# Patient Record
Sex: Male | Born: 1950 | Race: White | Hispanic: No | Marital: Married | Smoking: Never smoker
Health system: Southern US, Community
[De-identification: ages and names within clinical notes are randomized; demographics above are authoritative.]

## PROBLEM LIST (undated history)

## (undated) DIAGNOSIS — G40909 Epilepsy, unspecified, not intractable, without status epilepticus: Secondary | ICD-10-CM

## (undated) DIAGNOSIS — E785 Hyperlipidemia, unspecified: Secondary | ICD-10-CM

## (undated) DIAGNOSIS — Z9049 Acquired absence of other specified parts of digestive tract: Secondary | ICD-10-CM

## (undated) DIAGNOSIS — D3A00 Benign carcinoid tumor of unspecified site: Secondary | ICD-10-CM

## (undated) DIAGNOSIS — I4891 Unspecified atrial fibrillation: Secondary | ICD-10-CM

## (undated) DIAGNOSIS — I7771 Dissection of carotid artery: Secondary | ICD-10-CM

## (undated) HISTORY — DX: Epilepsy, unspecified, not intractable, without status epilepticus: G40.909

## (undated) HISTORY — PX: SMALL INTESTINE SURGERY: SHX150

## (undated) HISTORY — PX: PARTIAL COLECTOMY: SHX5273

## (undated) HISTORY — DX: Hyperlipidemia, unspecified: E78.5

## (undated) HISTORY — DX: Dissection of carotid artery: I77.71

## (undated) HISTORY — DX: Unspecified atrial fibrillation: I48.91

## (undated) HISTORY — DX: Acquired absence of other specified parts of digestive tract: Z90.49

---

## 1998-01-15 ENCOUNTER — Ambulatory Visit (HOSPITAL_COMMUNITY): Admission: RE | Admit: 1998-01-15 | Discharge: 1998-01-15 | Payer: Self-pay | Admitting: Neurosurgery

## 1998-01-29 ENCOUNTER — Emergency Department (HOSPITAL_COMMUNITY): Admission: EM | Admit: 1998-01-29 | Discharge: 1998-01-29 | Payer: Self-pay | Admitting: Emergency Medicine

## 2001-05-09 ENCOUNTER — Encounter: Payer: Self-pay | Admitting: Internal Medicine

## 2001-05-09 ENCOUNTER — Ambulatory Visit (HOSPITAL_COMMUNITY): Admission: RE | Admit: 2001-05-09 | Discharge: 2001-05-09 | Payer: Self-pay | Admitting: Internal Medicine

## 2002-11-14 ENCOUNTER — Encounter: Admission: RE | Admit: 2002-11-14 | Discharge: 2003-02-12 | Payer: Self-pay | Admitting: Internal Medicine

## 2003-05-24 ENCOUNTER — Ambulatory Visit (HOSPITAL_COMMUNITY): Admission: RE | Admit: 2003-05-24 | Discharge: 2003-05-24 | Payer: Self-pay | Admitting: Neurology

## 2003-05-24 ENCOUNTER — Encounter: Payer: Self-pay | Admitting: Neurology

## 2003-06-07 ENCOUNTER — Ambulatory Visit (HOSPITAL_COMMUNITY): Admission: RE | Admit: 2003-06-07 | Discharge: 2003-06-07 | Payer: Self-pay | Admitting: Neurology

## 2003-12-19 ENCOUNTER — Inpatient Hospital Stay (HOSPITAL_COMMUNITY): Admission: EM | Admit: 2003-12-19 | Discharge: 2003-12-31 | Payer: Self-pay | Admitting: Emergency Medicine

## 2003-12-25 ENCOUNTER — Encounter (INDEPENDENT_AMBULATORY_CARE_PROVIDER_SITE_OTHER): Payer: Self-pay | Admitting: Specialist

## 2004-01-15 ENCOUNTER — Encounter: Admission: RE | Admit: 2004-01-15 | Discharge: 2004-01-15 | Payer: Self-pay | Admitting: General Surgery

## 2004-07-23 ENCOUNTER — Encounter: Admission: RE | Admit: 2004-07-23 | Discharge: 2004-07-23 | Payer: Self-pay | Admitting: General Surgery

## 2005-01-18 ENCOUNTER — Encounter: Admission: RE | Admit: 2005-01-18 | Discharge: 2005-01-18 | Payer: Self-pay | Admitting: General Surgery

## 2005-02-12 ENCOUNTER — Encounter: Payer: Self-pay | Admitting: Cardiology

## 2005-03-02 ENCOUNTER — Ambulatory Visit: Payer: Self-pay

## 2005-03-04 ENCOUNTER — Ambulatory Visit: Payer: Self-pay | Admitting: Cardiology

## 2005-07-16 ENCOUNTER — Encounter: Admission: RE | Admit: 2005-07-16 | Discharge: 2005-07-16 | Payer: Self-pay | Admitting: General Surgery

## 2005-07-21 ENCOUNTER — Ambulatory Visit: Payer: Self-pay | Admitting: Cardiology

## 2005-10-25 ENCOUNTER — Encounter: Admission: RE | Admit: 2005-10-25 | Discharge: 2005-10-25 | Payer: Self-pay | Admitting: Neurology

## 2006-04-06 ENCOUNTER — Encounter: Admission: RE | Admit: 2006-04-06 | Discharge: 2006-04-06 | Payer: Self-pay | Admitting: Neurology

## 2006-05-20 ENCOUNTER — Encounter: Admission: RE | Admit: 2006-05-20 | Discharge: 2006-05-20 | Payer: Self-pay | Admitting: General Surgery

## 2006-08-19 ENCOUNTER — Ambulatory Visit: Payer: Self-pay | Admitting: Cardiology

## 2007-04-26 IMAGING — CT CT PELVIS W/ CM
2 of 5 series · 13 of 36 positions shown, 19 images · IV contrast (READY/WATER & [ID] OMNI 300)
Comparison: Abdominal/pelvic CT, 07/16/05.

CLINICAL DATA: Carcinoid tumor of the colon post resection, January 2004.  Chronic diarrhea. 
 ABDOMEN CT WITH CONTRAST:
TECHNIQUE: Multidetector CT imaging of the abdomen was performed following the standard protocol during bolus administration of intravenous contrast.
 Contrast:  100 cc Omnipaque 300.   Oral contrast was given.
TECHNIQUE: Multidetector CT imaging of the pelvis was performed following the standard protocol during bolus administration of intravenous contrast.

[Series 2: a&p w/ · axial · 0.70mm/px · z∈[-396,-76]mm · 9 of 81 slices shown, 15 images (1 of 2)]
[im 9/81  soft-tissue]
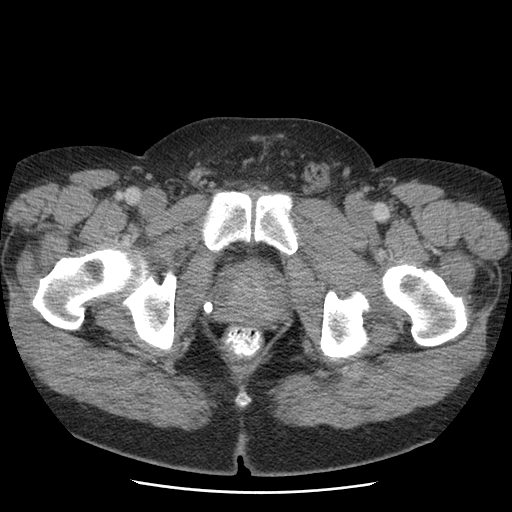
[im 9/81  bone]
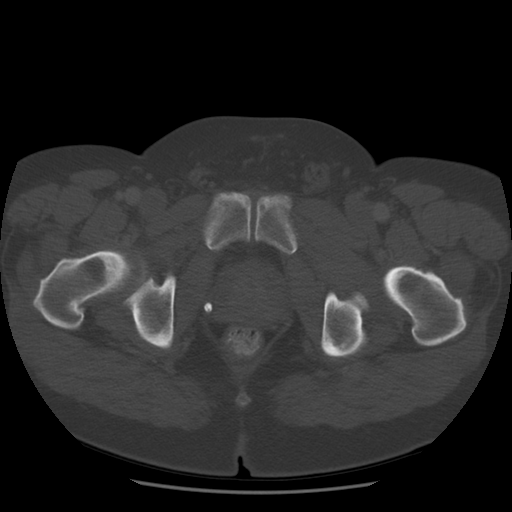
[im 17/81  soft-tissue]
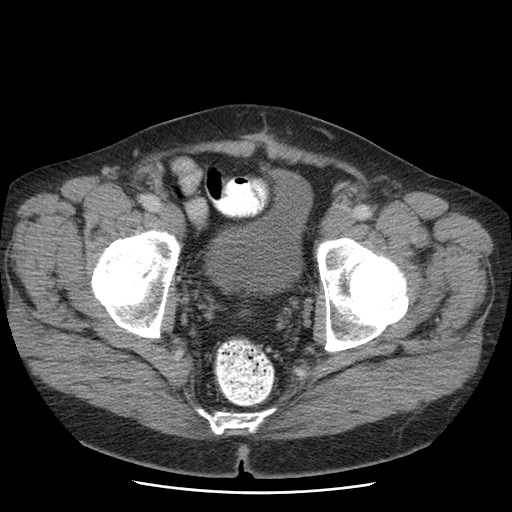
[im 25/81  soft-tissue]
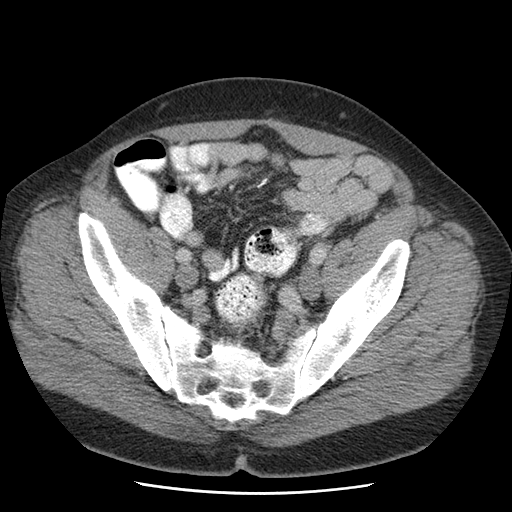
[im 33/81  soft-tissue]
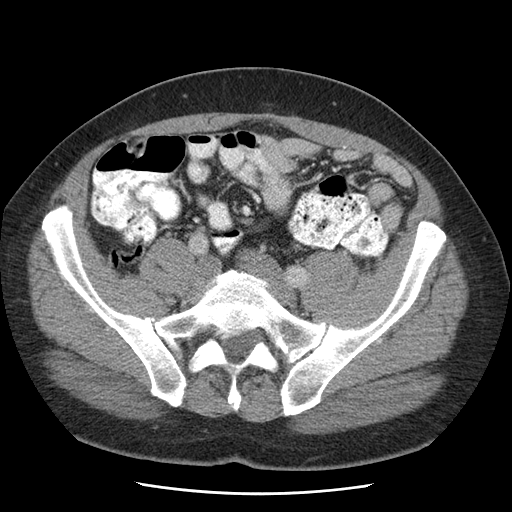
[im 41/81  soft-tissue]
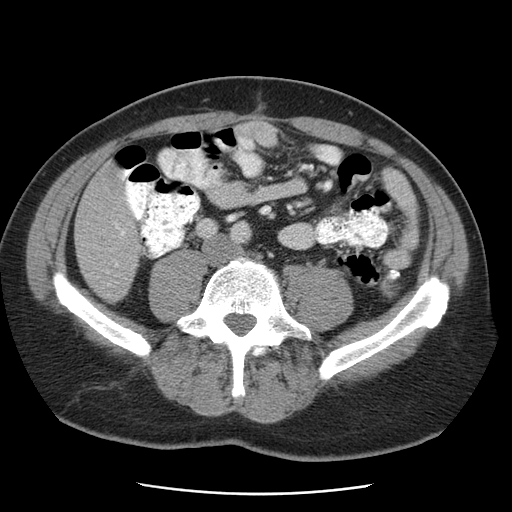
[im 49/81  soft-tissue]
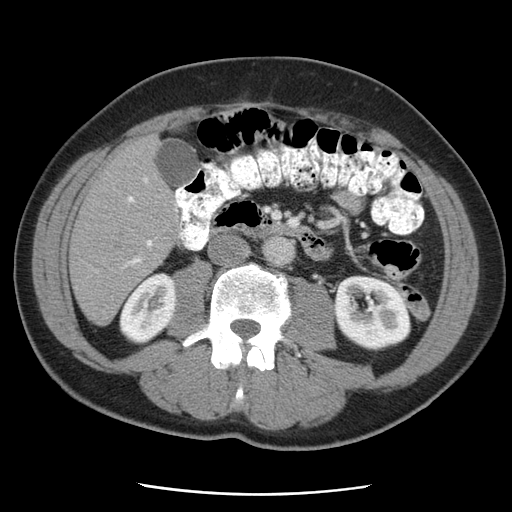
[im 49/81  lung]
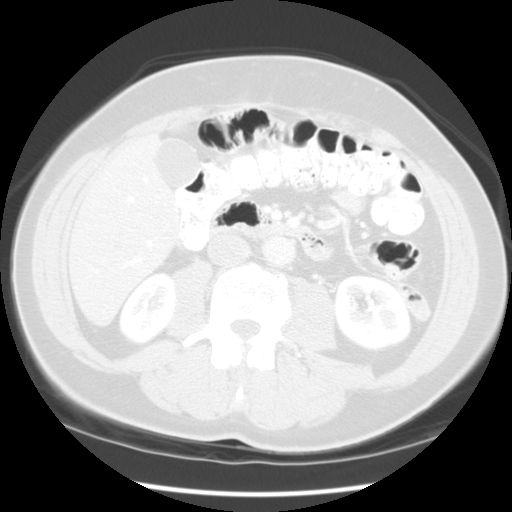
[im 57/81  soft-tissue]
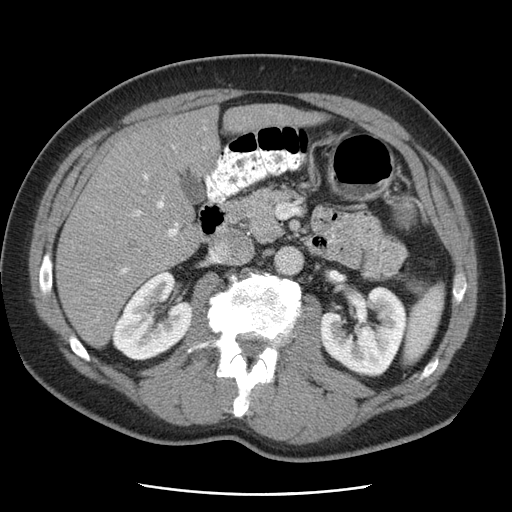
[im 57/81  lung]
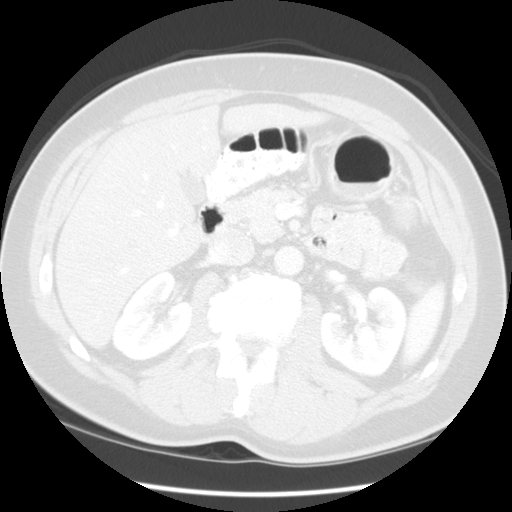
[im 65/81  soft-tissue]
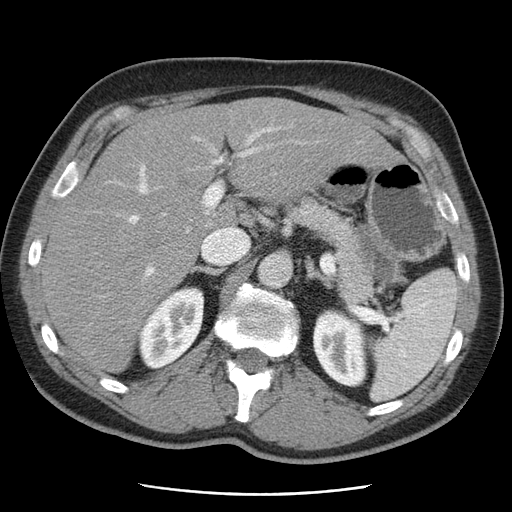
[im 65/81  lung]
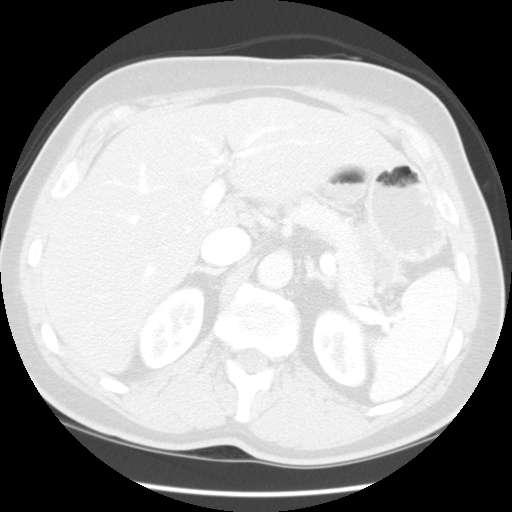
[im 73/81  soft-tissue]
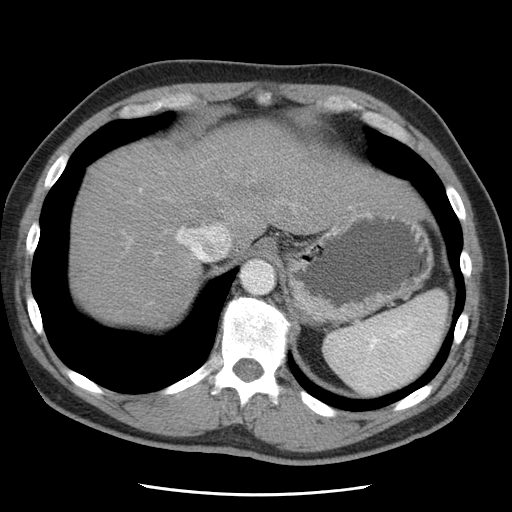
[im 73/81  lung]
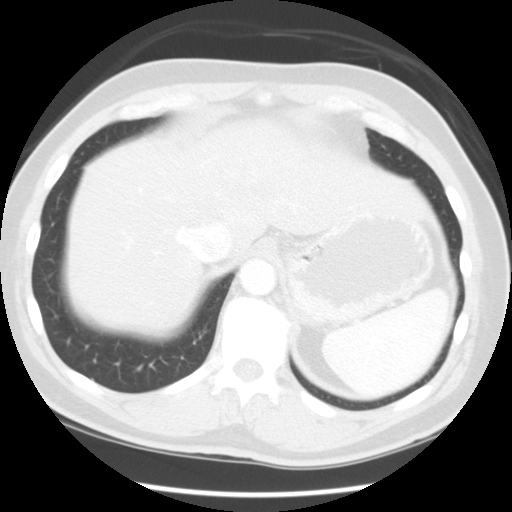
[im 73/81  bone]
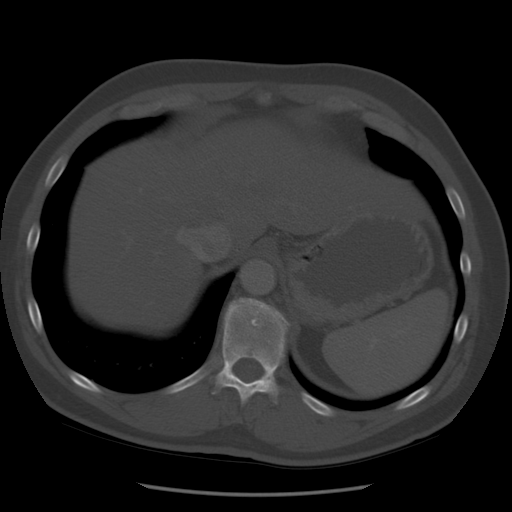

[Series 102: a&p w/ · axial · 0.70mm/px · z∈[-188,-128]mm · 4 of 130 slices shown (2 of 2)]
[im 9/130  soft-tissue]
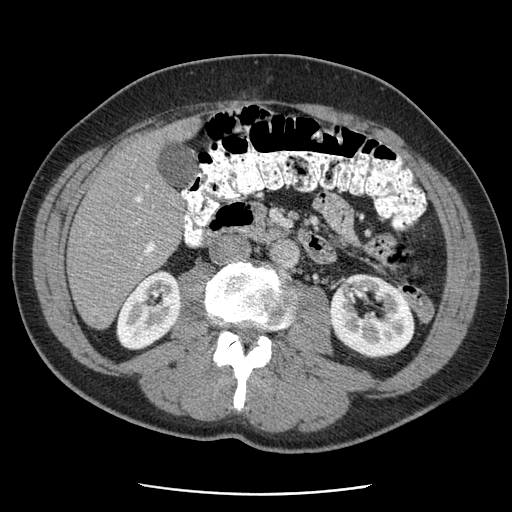
[im 25/130  soft-tissue]
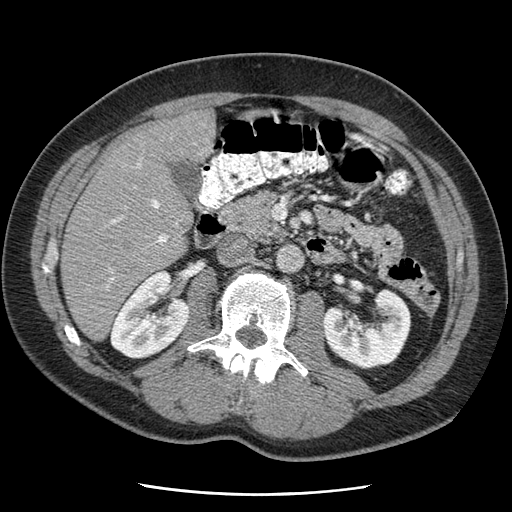
[im 41/130  soft-tissue]
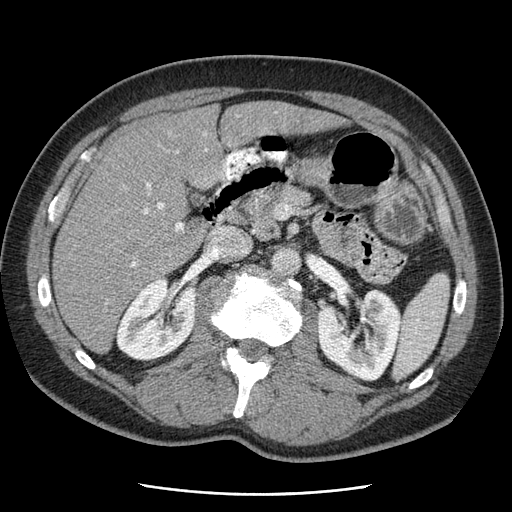
[im 57/130  soft-tissue]
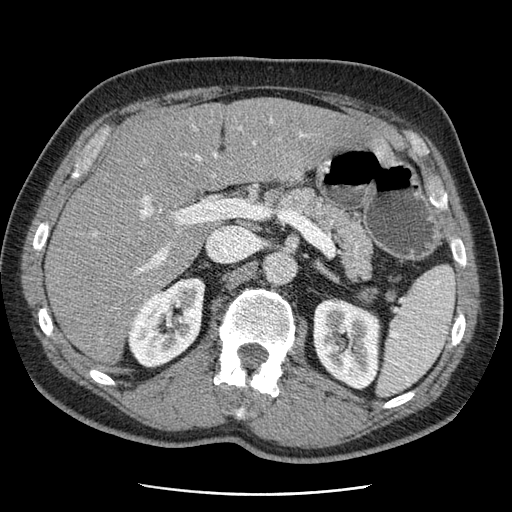

[13 of 36 positions shown; findings below may reference images not displayed]

FINDINGS: The lung bases are clear.  There is no pleural effusion.  The liver demonstrates no abnormal enhancement or focal lesion. The spleen, gallbladder, pancreas, adrenal glands and kidneys appear normal.  No enlarged abdominal lymph nodes are seen.   Arabogo Malm caval and peripancreatic lymph nodes are unchanged.  No ascites or omental nodularity is demonstrated.
IMPRESSION: Stable abdominal CT.  No evidence of metastatic disease. 
 PELVIS CT WITH CONTRAST:
FINDINGS: There is no pelvic mass, fluid collection, or inflammatory process.  No enlarged pelvic lymph nodes are seen.  The appendix is visualized and appears normal.
IMPRESSION: Stable examination. No evidence of metastatic disease.

## 2007-08-08 ENCOUNTER — Ambulatory Visit: Payer: Self-pay | Admitting: Cardiology

## 2008-08-08 ENCOUNTER — Encounter: Payer: Self-pay | Admitting: Cardiology

## 2008-08-08 ENCOUNTER — Ambulatory Visit: Payer: Self-pay

## 2008-08-08 ENCOUNTER — Ambulatory Visit: Payer: Self-pay | Admitting: Cardiology

## 2008-08-08 DIAGNOSIS — I119 Hypertensive heart disease without heart failure: Secondary | ICD-10-CM

## 2008-08-08 DIAGNOSIS — E7800 Pure hypercholesterolemia, unspecified: Secondary | ICD-10-CM | POA: Insufficient documentation

## 2008-08-08 DIAGNOSIS — I4891 Unspecified atrial fibrillation: Secondary | ICD-10-CM | POA: Insufficient documentation

## 2009-08-18 ENCOUNTER — Encounter: Payer: Self-pay | Admitting: Cardiology

## 2009-08-19 ENCOUNTER — Ambulatory Visit: Payer: Self-pay | Admitting: Cardiology

## 2010-01-23 ENCOUNTER — Encounter: Payer: Self-pay | Admitting: Cardiology

## 2010-01-25 ENCOUNTER — Encounter: Payer: Self-pay | Admitting: Cardiology

## 2010-01-29 ENCOUNTER — Telehealth (INDEPENDENT_AMBULATORY_CARE_PROVIDER_SITE_OTHER): Payer: Self-pay | Admitting: *Deleted

## 2010-01-29 ENCOUNTER — Ambulatory Visit: Payer: Self-pay | Admitting: Cardiology

## 2010-01-29 DIAGNOSIS — R072 Precordial pain: Secondary | ICD-10-CM | POA: Insufficient documentation

## 2010-01-29 LAB — CONVERTED CEMR LAB
BUN: 17 mg/dL (ref 6–23)
CO2: 31 meq/L (ref 19–32)
Chloride: 105 meq/L (ref 96–112)
Creatinine, Ser: 1 mg/dL (ref 0.4–1.5)
Sodium: 140 meq/L (ref 135–145)

## 2010-02-04 ENCOUNTER — Telehealth: Payer: Self-pay | Admitting: Cardiology

## 2010-02-18 ENCOUNTER — Telehealth: Payer: Self-pay | Admitting: Cardiology

## 2010-07-07 ENCOUNTER — Encounter: Payer: Self-pay | Admitting: Cardiology

## 2010-07-09 ENCOUNTER — Encounter: Payer: Self-pay | Admitting: Cardiology

## 2010-07-10 ENCOUNTER — Ambulatory Visit: Payer: Self-pay | Admitting: Cardiology

## 2010-08-21 ENCOUNTER — Ambulatory Visit: Payer: Self-pay

## 2010-08-21 ENCOUNTER — Ambulatory Visit: Payer: Self-pay | Admitting: Cardiology

## 2010-10-06 NOTE — Progress Notes (Signed)
Summary: Question about stress tes  Phone Note Call from Patient Call back at [phone]   Caller: Patient Summary of Call: Pt calling regarding a stress test Initial call taken by: Judie Grieve,  February 18, 2010 4:17 PM  Follow-up for Phone Call        Spoke with pt. regarding stress test. Pt. would like to know if it was okay for Dr. Juanda Chance to re-schedule Myoview  in November this year. I let pt. know on the last phone note, Dr. Juanda Chance said it is fine to wait . Pt is to call to schedule test sooner if he has any symptoms. Pt. verbalized understanding. Follow-up by: Ollen Gross, RN, BSN,  February 18, 2010 4:44 PM

## 2010-10-06 NOTE — Progress Notes (Signed)
Summary: QUESTION ABOUT STRESS TEST  Phone Note Call from Patient Call back at [phone]   Caller: Patient Summary of Call: PT HAVE QUESTION ABOUT STRESS TEST  AND RS THE TEST Initial call taken by: Judie Grieve,  February 04, 2010 11:40 AM  Follow-up for Phone Call        I called and spoke with the pt. He has not been approved for the PROMISE study. He has cancelled his myoview for tomorrow. He states he has only had one isolated incident of chest burning and that he felt like the myoview was being done primarily as a f/u since his last one has been so long ago. He states he now has a $1000 deductible to meet. He would like to know if Dr. Juanda Chance feels that he can wait until November to have his test done since he will be eliglble for [employer] at that time. I explained I will review this with Dr. Juanda Chance. He has had no other symptoms. Follow-up by: Sherri Rad, RN, BSN,  February 04, 2010 1:23 PM  Additional Follow-up for Phone Call Additional follow up Details #1::        Per Dr. Juanda Chance- ok to wait until November for [facility]. The pt is aware. He will call to schedule sooner should he have any more symptoms.  Additional Follow-up by: Sherri Rad, RN, BSN,  February 06, 2010 11:32 AM

## 2010-10-06 NOTE — Progress Notes (Signed)
  Phone Note Outgoing Call   Action Taken: Information Sent Details for Reason: Promise Study Summary of Call: I spoke to patient to let him know labs were okay. Pt was scheduled for exercise stress on 02/05/2010 at 8am. instructions were given to pt. Pt verbalized understanding. Pt is to hold Metformin and insulin day of test.

## 2010-10-06 NOTE — Consult Note (Signed)
Summary: [facility] Primary [facility] Referral Form/Office Note  [facility] Primary Care Referral Form/Office Note   Imported By: Roderic Ovens 02/26/2010 09:38:46  _____________________________________________________________________  External Attachment:    Type:   Image     Comment:   External Document

## 2010-10-06 NOTE — Assessment & Plan Note (Signed)
Summary: rov   Visit Type:  Initial Consult Primary Provider:  Dr .Olivia Canter  CC:  pt saw Dr Tresa Endo he had eposide of chest pain and arm pain . Pt had a ekg there.Dr Tresa Endo thought it may be time for pt to have an stress test.  History of Present Illness: Patient returned for a followup visit early because of symptoms of chest and arm pain. He developed some sharp left-sided chest pain a couple of weeks ago which was not related to exertion. He later developed some intermittent pain in his left arm which was also not related to exertion. He saw his primary care physician who did an electrocardiogram which was unchanged from his previous ECGs here. Because of his positive risk profile he was referred here for further evaluation. He has had no exertional chest pain.  He has chronic atrial fibrillation and this is being managed with rate control and Coumadin. He also has hypertension, hyperlipidemia and diabetes.  His previous history is significant for a carotid dissection in the past, seizure disorder, and carcinoid syndrome requiring surgery.  Current Medications (verified): 1)  Tricor 145 Mg Tabs (Fenofibrate) .Marland Kitchen.. 1 Tab Once Daily 2)  Crestor 10 Mg Tabs (Rosuvastatin Calcium) .... Take One Tablet By Mouth Daily. 3)  Warfarin Sodium 5 Mg Tabs (Warfarin Sodium) .... Use As Directed By Anticoagulation Clinic 4)  Diltiazem Hcl Cr 180 Mg Xr24h-Cap (Diltiazem Hcl) .Marland Kitchen.. 1 Tab Once Daily 5)  Digoxin 0.25 Mg Tabs (Digoxin) .Marland Kitchen.. 1 Tab Once Daily 6)  Altace 10 Mg Caps (Ramipril) .Marland Kitchen.. 1 Tab Once Daily 7)  Metformin Hcl 500 Mg Tabs (Metformin Hcl) .... 2 Tabs Two Times A Day 8)  Lantus 100 Unit/ml Soln (Insulin Glargine) .... As Directed  Allergies (verified): 1)  ! * Tamiflu  Past History:  Past Medical History: Reviewed history from 08/08/2008 and no changes required.   1. Chronic atrial fibrillation. 2. Diabetes. 3. History of previous spontaneous carotid artery dissection. 4. History  of a partial bowel resection for a carcinoid. 5. History of seizure disorder. 6. Hyperlipidemia.   Review of Systems       ROS is negative except as outlined in HPI.   Vital Signs:  Patient profile:   60 year old male Height:      70 inches Weight:      226 pounds BMI:     32.54 Pulse rate:   82 / minute BP sitting:   106 / 71  (left arm) Cuff size:   large  Vitals Entered By: Burnett Kanaris, CNA (Jan 29, 2010 10:51 AM)  Physical Exam  Additional Exam:  Gen. Well-nourished, in no distress   Neck: No JVD, thyroid not enlarged, no carotid bruits Lungs: No tachypnea, clear without rales, rhonchi or wheezes Cardiovascular: Rhythm regular, PMI not displaced,  heart sounds  normal, no murmurs or gallops, no peripheral edema, pulses normal in all 4 extremities. Abdomen: BS normal, abdomen soft and non-tender without masses or organomegaly, no hepatosplenomegaly. MS: No deformities, no cyanosis or clubbing   Neuro:  No focal sns   Skin:  no lesions    Impression & Recommendations:  Problem # 1:  CHEST PAIN-PRECORDIAL (ICD-786.51)  His chest pain is atypical for ischemia but he does have a very high risk profile with diabetes, hypertension and hyperlipidemia. I think we should allow him further. I discussed with him they promise trial. If he enrolled in this trial he would be randomized to our standard evaluation versus CT angiography.  In this case I would recommend either a rest stress exercise nuclear study or CT angiography. Will discuss with him about enrollment in this trial as a way to evaluate his problem further. His updated medication list for this problem includes:    Warfarin Sodium 5 Mg Tabs (Warfarin sodium) ..... Use as directed by anticoagulation clinic    Diltiazem Hcl Cr 180 Mg Xr24h-cap (Diltiazem hcl) .Marland Kitchen... 1 tab once daily    Altace 10 Mg Caps (Ramipril) .Marland Kitchen... 1 tab once daily  Orders: TLB-BMP (Basic Metabolic Panel-BMET) (80048-METABOL) Nuclear Stress Test (Nuc  Stress Test)  Problem # 2:  ATRIAL FIBRILLATION (ICD-427.31) He has chronic atrial fibrillation and is managed with rate control and Coumadin. This problem appears stable. His updated medication list for this problem includes:    Warfarin Sodium 5 Mg Tabs (Warfarin sodium) ..... Use as directed by anticoagulation clinic    Digoxin 0.25 Mg Tabs (Digoxin) .Marland Kitchen... 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 His updated medication list for this problem includes:    Diltiazem Hcl Cr 180 Mg Xr24h-cap (Diltiazem hcl) .Marland Kitchen... 1 tab once daily    Altace 10 Mg Caps (Ramipril) .Marland Kitchen... 1 tab once daily  Patient Instructions: 1)  Your physician recommends that you have lab work today: bmet (786.50;427.31) 2)  You are being enrolled in the PROMISE study. 3)  Your physician recommends that you continue on your current medications as directed. Please refer to the Current Medication list given to you today. 4)  Your physician wants you to follow-up in:  6 months. You will receive a reminder letter in the mail two months in advance. If you don't receive a letter, please call our office to schedule the follow-up appointment.

## 2010-10-06 NOTE — Assessment & Plan Note (Signed)
Summary: f64m   Visit Type:  Follow-up Primary Provider:  Dr .Olivia Canter  CC:  no complaints.  History of Present Illness: Mitchell Bright is 60 years old and return for management of chronic atrial fibrillation. He has a long history of chronic atrial fibrillation managed with rate control and Coumadin. He also has diabetes hypertension and hyperlipidemia.  When I saw him last time he had some atypical chest pain and would consider doing a stress test but we ended up canceling this to insurance reasons and his symptoms have cleared.  He has occasional palpitations and no chest pain or shortness of breath.  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 He's getting involved with mediations more than litigation.  Current Medications (verified): 1)  Tricor 145 Mg Tabs (Fenofibrate) .Marland Kitchen.. 1 Tab Once Daily 2)  Crestor 10 Mg Tabs (Rosuvastatin Calcium) .... Take One Tablet By Mouth Daily. 3)  Warfarin Sodium 5 Mg Tabs (Warfarin Sodium) .... Use As Directed By Anticoagulation Clinic 4)  Diltiazem Hcl Cr 180 Mg Xr24h-Cap (Diltiazem Hcl) .Marland Kitchen.. 1 Tab Once Daily 5)  Digoxin 0.25 Mg Tabs (Digoxin) .Marland Kitchen.. 1 Tab Once Daily 6)  Altace 10 Mg Caps (Ramipril) .Marland Kitchen.. 1 Tab Once Daily 7)  Metformin Hcl 500 Mg Tabs (Metformin Hcl) .... 2 Tabs Two Times A Day 8)  Lantus 100 Unit/ml Soln (Insulin Glargine) .... As Directed 9)  Dilantin .... 2 Tabs Two Times A Day  Allergies (verified): 1)  ! * Tamiflu  Past History:  Past Medical History: 1. Chronic atrial fibrillation. 2. Diabetes. 3. History of previous spontaneous carotid artery dissection. 4. History of a partial bowel resection for a carcinoid. 5. History of seizure disorder. 6. Hyperlipidemia.   Review of Systems       ROS is negative except as outlined in HPI.   Vital Signs:  Patient profile:   60 year old male Height:      70 inches Weight:      224 pounds BMI:     32.26 BP sitting:   118 / 80  (left arm) Cuff  size:   regular  Vitals Entered By: Hardin Negus, RMA (July 10, 2010 12:10 PM)  Physical Exam  Additional Exam:  Gen. Well-nourished, in no distress   Neck: No JVD, thyroid not enlarged, no carotid bruits Lungs: No tachypnea, clear without rales, rhonchi or wheezes Cardiovascular: Rhythm irregular, PMI not displaced,  heart sounds  normal, no murmurs or gallops, no peripheral edema, pulses normal in all 4 extremities. Abdomen: BS normal, abdomen soft and non-tender without masses or organomegaly, no hepatosplenomegaly. MS: No deformities, no cyanosis or clubbing   Neuro:  No focal sns   Skin:  no lesions    Impression & Recommendations:  Problem # 1:  ATRIAL FIBRILLATION (ICD-427.31) He has chronic atrial fibrillation managed with Coumadin and rate control. this problem is stable. His updated medication list for this problem includes:    Warfarin Sodium 5 Mg Tabs (Warfarin sodium) ..... Use as directed by anticoagulation clinic    Digoxin 0.25 Mg Tabs (Digoxin) .Marland Kitchen... 1 tab once daily  Problem # 2:  HYPERCHOLESTEROLEMIA (ICD-272.0) He had a recent lipid profile by his primary care physician. His HDL was 32. Profile is otherwise good. I encouraged him on exercise weight reduction and low back carbohydrate diet. His updated medication list for this problem includes:    Tricor 145 Mg Tabs (Fenofibrate) .Marland Kitchen... 1 tab once daily  Crestor 10 Mg Tabs (Rosuvastatin calcium) .Marland Kitchen... Take one tablet by mouth daily.  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 His updated medication list for this problem includes:    Diltiazem Hcl Cr 180 Mg Xr24h-cap (Diltiazem hcl) .Marland Kitchen... 1 tab once daily    Altace 10 Mg Caps (Ramipril) .Marland Kitchen... 1 tab once daily  Appended Document: f65m    Clinical Lists Changes  Orders: Added new Service order of EKG w/ Interpretation (93000) - Signed Added new Referral order of  Treadmill (Treadmill) - Signed Observations: Added new observation of PI CARDIO: Your physician has requested that you have an exercise tolerance test in mid December with Dr. Juanda Chance. For further information please visit [email or web address]  Please also follow instruction sheet, as given. (07/10/2010 12:33)       Patient Instructions: 1)  Your physician has requested that you have an exercise tolerance test in mid December with Dr. Juanda Chance. For further information please visit [email or web address]  Please also follow instruction sheet, as given.

## 2011-01-19 NOTE — Assessment & Plan Note (Signed)
[facility]                            CARDIOLOGY OFFICE NOTE   Mitchell Bright                       MRN:          [id number]  DATE:08/08/2007                            DOB:          16-Jul-1951    PRIMARY CARE PHYSICIAN:  Dr. Pearson Bright in [place].   CLINICAL HISTORY:  Mitchell Bright is 60 years old and has chronic atrial  fibrillation.  He is currently on rate-control medications and Coumadin.  His [place] score is 1 based on diabetes.   He has been doing quite well and has had no chest pain or shortness of  breath.  He has had a rare palpitation this morning.   PAST MEDICAL HISTORY:  Significant for diabetes and hyperlipidemia.  He  indicated recent hemoglobin A1c was 11 and so his medications were  recently changed.   CURRENT MEDICATIONS:  Include TriCor, Crestor, Coumadin, aspirin,  Dilantin, diltiazem, digoxin, Altace, Avandaryl, Janumet, and Lovaza.   EXAMINATION TODAY:  The blood pressure is 113/75 and the pulse 80 and  irregular.  There is no venous distention.  The carotid pulses were full  without bruits.  Chest was clear.  Cardiac rhythm was irregular.  He had  no murmurs or gallops.  Abdomen was soft without organomegaly.  Peripheral pulses are full and there is no peripheral edema.   An electrocardiogram showed atrial fibrillation with controlled rate.  There were nonspecific ST-T changes.   IMPRESSION:  1. Chronic atrial fibrillation.  2. Diabetes.  3. History of previous spontaneous carotid artery dissection.  4. History of a partial bowel resection for a carcinoid.  5. History of seizure disorder.  6. Hyperlipidemia.   RECOMMENDATIONS:  I think Mitchell Bright is doing quite well.  We will not make  any change in his medication.  We will plan to see him back in followup  in a year and do an echocardiogram on that day at that visit.     Mitchell Elvera Lennox Juanda Chance, MD, [facility]  Electronically Signed    BRB/MedQ  DD: 08/08/2007  DT:  08/09/2007  Job #: [id number]

## 2011-01-19 NOTE — Assessment & Plan Note (Signed)
Mitchell Bright                            CARDIOLOGY OFFICE NOTE   Mitchell Bright, Mitchell Bright                       MRN:          [id number]  DATE:08/08/2008                            DOB:          24-Aug-1951    PRIMARY CARE PHYSICIAN:  Mitchell Forster, MD in [place].   CLINICAL HISTORY:  Mitchell Bright is a 60 year old who returned for  management of chronic atrial fibrillation.  Mitchell Bright developed atrial  fibrillation and was asymptomatic, and we did not try and restore sinus  rhythm.  Mitchell CHADS score is 2 based on diabetes and hypertension.  He is  being managed on Coumadin and rate control medications.   He stated he has been doing quite well.  He has had no recent chest  pain, shortness of breath, or palpitations.   Mitchell past medical history is significant for diabetes, hyperlipidemia,  and hypertension.  He also has a history of spontaneous carotid artery  dissection, and he has a history of a seizure disorder.   CURRENT MEDICATIONS:  1. TriCor 145 mg daily.  2. Crestor 10 mg daily.  3. Coumadin.  4. Dilantin.  5. Aspirin 81 mg daily.  6. Diltiazem 180 mg daily.  7. Digoxin 0.25 mg daily.  8. Altace 10 mg daily.  9. __________.  10.Metformin.  11.Lantus insulin, which is new.   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  He states Mitchell work is quite busy.  Mitchell Bright, Mitchell  former [name], previously had worked for [employer] and now  has taken a job with [employer].  He does not smoke.   PHYSICAL EXAMINATION:  Blood pressure 114/68 and pulse 84 and regular.  There is no venous distension.  Mitchell carotid pulses are full without  bruits.  Chest is clear.  Cardiac rhythm is irregular.  I hear no  murmurs or gallops.  Mitchell abdomen is soft with normal bowel sounds.  There is no hepatosplenomegaly.  Peripheral pulses are full.  There is  no peripheral edema.   Electrocardiogram showed atrial  fibrillation with controlled rate and  was otherwise normal.   IMPRESSION:  1. Chronic atrial fibrillation, managed with rate control and      Coumadin.  2. Diabetes.  3. Hypertension.  4. History of previous spontaneous carotid artery dissection.  5. History of seizure disorder.  6. History of partial small bowel resection for carcinoid.  7. Hyperlipidemia.   RECOMMENDATIONS:  I think, Mitchell Bright is doing quite well.  We did an  echocardiogram today, and Mitchell LV function is good with an ejection  fraction that I estimated at 60%.  We will not make any change in Mitchell  treatment today, and we will plan to see him back for a followup visit  in 1 year.     Bruce Elvera Lennox Juanda Chance, MD, [facility]  Electronically Signed    BRB/MedQ  DD: 08/08/2008  DT: 08/09/2008  Job #: [id number]

## 2011-01-22 NOTE — Consult Note (Signed)
NAME:  Mitchell Bright, Mitchell Bright                          ACCOUNT NO.:  [id number]   MEDICAL RECORD NO.:  [id number]                   PATIENT TYPE:  INP   LOCATION:  4710                                 FACILITY:  [facility]   PHYSICIAN:  Olga Millers, M.D. [facility]            DATE OF BIRTH:  08-25-1951   DATE OF CONSULTATION:  12/20/2003  DATE OF DISCHARGE:                                   CONSULTATION   HISTORY OF PRESENT ILLNESS:  Mitchell Bright is a 60 year old male with a past  medical history of permanent atrial fibrillation, recurrent GI bleeds,  seizure disorder, diabetes mellitus, and hyperlipidemia whom we were asked  to evaluate for syncope.  The patient has been in permanent atrial  fibrillation for approximately 15 years by report.  Report of his LV  function is not on the chart.  He is followed by Dr. Juanda Chance.  He is not on  Coumadin secondary to recurrent GI bleeds.  He typically does not have  dyspnea on exertion, orthopnea, PND, pedal edema, palpitations, or chest  pain.  He has had problems with recurrent GI bleeds from an unidentified  source.  With each episode he has had a syncopal episode while on the  commode.  The patient developed melena and hematochezia beginning yesterday,  and was admitted by the gastroenterology service.  His hemoglobin has  decreased from 14.3 to 10.7.  His initial heart rate was 70, with occasional  tachycardia documented to 120 to 130.  This morning at approximately 3:30  a.m., he was having a bowel movement and subsequently had a syncopal  episode.  There was no associated shortness of breath, chest pain, or  palpitations.  His heart rate did apparently decrease to the 30s according  to the nurses report with a greater than 3 second pause.  Cardiology was  therefore asked to evaluate.   MEDICATIONS:  1. Protonix 40 mg p.o. daily.  2. Cardizem CD 180 mg p.o. daily.  3. Digoxin 0.25 mg p.o. daily.  4. Altace.  5. Dilantin.  6. Avandia.  7. Metformin.  8. Amaryl.  9. Multivitamin.   ALLERGIES:  No known drug allergies.   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   FAMILY HISTORY:  Negative for coronary artery disease.   PAST MEDICAL HISTORY:  1. Diabetes mellitus.  2. Hyperlipidemia, but there is no hypertension.  3. History of permanent atrial fibrillation.  4. History of recurrent GI bleeds, as well as diverticulosis.  5. History of gastroesophageal reflux disease.  6. He has been placed on Dilantin for a possible seizure disorder by Dr.     Sandria Manly.  7. He has had prior cataract surgery.  8. 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   REVIEW OF SYSTEMS:  There is no headache, fever, or chills.  There is no  productive cough  or hemoptysis.  There is no dysphagia or odynophagia, but  there has been melena and hematochezia.  There is no dysuria or hematuria.  There is no rash or seizure activity.  There is no orthopnea, PND, or pedal  edema.  The remaining systems are negative.   PHYSICAL EXAMINATION:  VITAL SIGNS:  Blood pressure of 122/80, pulse 88.  He  is afebrile.  GENERAL:  He is well-developed, well-nourished and in no acute distress.  He  is presently laying flat following an angiogram to identify the source of  his GI bleed.  He does not appear to be depressed.  SKIN:  Warm and dry.  There is no peripheral clubbing.  HEENT:  Unremarkable with normal eyelids.  NECK:  Supple with normal upstrokes bilaterally.  There are no bruits noted.  There is no jugular venous distention and no thyromegaly noted.  CHEST:  Clear to auscultation, normal expansion.  CARDIOVASCULAR:  Irregular rhythm.  I cannot appreciate murmurs, rubs, or  gallops.  ABDOMEN:  Nontender, nondistended, positive bowel sounds.  No  hepatosplenomegaly, no mass appreciated.  There is no abdominal bruit.  He  does have 2+ femoral pulses bilaterally.  There are no bruits noted.  He is  status  post arteriogram from the right groin.  EXTREMITIES:  No edema, and I can palpate no cords.  He has 2+ dorsalis  pedis pulses bilaterally.  NEUROLOGIC:  Grossly intact.   LABORATORY DATA:  His hemoglobin this morning is 10.7, hematocrit 31.2.  BUN  and creatinine are 20 and 1, with a potassium of 4.8.   DIAGNOSES:  1. Syncopal episode.  2. Permanent atrial fibrillation.  3. Recurrent gastrointestinal bleeds.  4. Diabetes mellitus.  5. Hyperlipidemia.  6. History of seizure disorder.   PLAN:  Mitchell Bright presents with a gastrointestinal bleed and we are asked to  evaluate for syncope.  This is a very complicated issue in Mitchell Bright.  He  has only had syncopal episodes in the setting of gastrointestinal bleeds  while having bowel movements.  His hemoglobin has decreased from 14.3 to  10.7.  His atrial fibrillation is controlled with the combination of  Cardizem and digoxin.  He has had increased heart rate at times, but this  has occurred in the setting of gastrointestinal bleed.  He then had his  syncopal episode while on the commode having a bowel movement and there was  bradycardia with a pause greater than three seconds documented.  We will  obtain records from the office concerning his left ventricular function.  We  will also continue telemetry as well as Cardizem and digoxin at his present  doses.  If he has further bradycardia, then he will most likely require  pacemaker.  If not, then I would favor a 48 hour Holter monitor once his  gastrointestinal bleed has been treated.  If he has tachy-brady documented  then, then we would consider pacemaker at that point.  If his source of  gastrointestinal bleed is identified and corrected, then he would benefit  from Coumadin long-term.  We will ask Dr. Graciela Husbands to review.                                               Olga Millers, M.D. [facility]    BC/MEDQ  D:  12/20/2003  T:  12/21/2003  Job:  [id number]

## 2011-01-22 NOTE — Consult Note (Signed)
NAME:  Mitchell Bright, Mitchell Bright                          ACCOUNT NO.:  [id number]   MEDICAL RECORD NO.:  [id number]                   PATIENT TYPE:  INP   LOCATION:  4710                                 FACILITY:  [facility]   PHYSICIAN:  Ollen Gross. Vernell Morgans, M.D.              DATE OF BIRTH:  1951/03/18   DATE OF CONSULTATION:  12/20/2003  DATE OF DISCHARGE:                                   CONSULTATION   HISTORY OF PRESENT ILLNESS:  Mitchell Bright is a 60 year old white male who  presented yesterday with passing bright red blood per rectum.  This happened  during the night last night, and this morning, he passed out.  He states  this is the third episode of this in the last two to three years.  His last  episode occurred in January of 2005 in [place] at which time workup failed  to identify the source of bleeding.  He has not had any abdominal pain, and  currently denies any nausea, vomiting, fevers, chills, chest pain, shortness  of breath, diarrhea or dysuria.  He has not been lightheaded today.  He  actually feels better and has not passed any blood since about 3 o'clock  this morning.  His hemoglobin during this admission has gone from 14 to 10,  and he is currently receiving transfusion of blood.   He did have a nuclear medicine study yesterday which seemed to localize to  his right lower quadrant but was somewhat nonspecific.  An arteriogram done  this morning could not demonstrate any evidence of bleeding or AV  malformation.  The rest of his review of systems is unremarkable.   PAST MEDICAL HISTORY:  Significant for chronic atrial fibrillation,  diabetes, hyperlipidemia, diverticulosis, possible seizure disorder and GI  bleed.  Gastroesophageal reflux.   PAST SURGICAL HISTORY:  Significant for cataract surgery.   MEDICATIONS:  1. Protonix.  2. Digoxin.  3. Cardizem.  4. Dilantin.  5. Altace.   ALLERGIES:  No known drug allergies.   SOCIAL HISTORY:  He denies the use of alcohol or  tobacco products.  He is an  Pensions consultant.   FAMILY HISTORY:  Noncontributory.   PHYSICAL EXAMINATION:  GENERAL:  He is hemodynamically stable.  He is a well-  developed, well-nourished white male, in no acute distress.  SKIN:  Warm and dry with no jaundice.  HEENT:  Eyes:  Extraocular muscles are intact.  Pupils equal, round and  reactive to light.  Sclerae nonicteric.  LUNGS:  Clear bilaterally with no use of accessory respiratory muscles.  HEART:  Regular rate and rhythm.  Heart is irregular with an impulse in the  left chest.  ABDOMEN:  Soft, nontender, with no palpable mass or hepatosplenomegaly.  EXTREMITIES:  No cyanosis, clubbing or edema.  PSYCHOLOGIC:  He is alert, oriented x3 with no evidence of anxiety or  depression.   ASSESSMENT/PLAN:  2. A 60 year old white  male with bright red blood per rectum that is most     likely from a lower GI source, the location of which has not been able to     be localized as of yet.  Localizing this site would give him the best     opportunity to have surgery to correct this problem; otherwise, resection     would be somewhat blind and would have a risk of recurrent bleeding.  2. The studies so far have not been able to localize the potential source of     bleeding, but he is scheduled for a colonoscopy tomorrow and capsule     endoscopy in the very near future.  I would agree with these tests to try     to help localize the site.  3. I have discussed with him in detail the different options for managing     this problem.  He understands and agrees with this current course of     therapy.   We will continue to follow him closely with you.  I appreciate the  opportunity to help with the care of this patient.                                               Ollen Gross. Vernell Morgans, M.D.    PST/MEDQ  D:  12/20/2003  T:  12/22/2003  Job:  [id number]

## 2011-01-22 NOTE — H&P (Signed)
NAME:  Mitchell Bright, Mitchell Bright                          ACCOUNT NO.:  [id number]   MEDICAL RECORD NO.:  [id number]                   PATIENT TYPE:  INP   LOCATION:  1828                                 FACILITY:  [facility]   PHYSICIAN:  Lina Sar, M.D. [facility]               DATE OF BIRTH:  09/09/1950   DATE OF ADMISSION:  12/19/2003  DATE OF DISCHARGE:                                HISTORY & PHYSICAL   GASTROENTEROLOGY PHYSICIAN:  Iva Boop, M.D.   FAMILY DOCTOR:  Kingsley Callander. Ouida Sills, M.D.   CARDIOLOGIST:  Charlies Constable, M.D.   CHIEF COMPLAINT:  Black stools followed by red bloody stools.   HISTORY AND PHYSICAL:  Mitchell Bright is a very nice, 60 year old, white male.  He has a history of GI bleed with similar presentations in January of 2005  in [place], [place], and also in 2002 while on a cruise in [place].  He had a colon endoscopy at the [facility] in [place] in 2002 and a  colon endoscopy with small bowel follow through in [place], [place], in 2005.  The scope records from January of 2005 show sigmoid  diverticulosis and a single ascending colon tick.  Some residual blood was  seen within the colon at the time of colonoscopy, but no culprit  diverticular source could be located.  The upper endoscopy was apparently  negative.  Also in 2002, similar findings in the left colon, but on upper  endoscopy there he had gastritis and duodenitis, but at the time was using  ibuprofen.  He also had been on Coumadin previously for a long history of  atrial fibrillation, but he no longer takes that medication for a few years  now.  He did have a small bowel follow through in January of 2005 and that  study was normal.  With these episodes of GI bleeding, the patient has had  syncopal spells associated with some mild seizure-like activity and had been  started on Dilantin in [place], [place].  Dr. Sandria Manly evaluated him  and continued him on this medication.  Apparently an EEG by  Dr. Sandria Manly had  shown some abnormal pattern.   The patient's case was reviewed by Dr. Leone Payor at an office visit in  February of 2005.  No further tests were ordered.  His hemoglobin on that  visit was 13.8 with normal MVC.  This past Monday, three days ago, the  patient felt a bit weak and sort of woozy and noticed his atrial  fibrillation a little bit more than usual because usually he does not even  notice it.  He took an extra couple of aspirin for a total of three that  day, laid down for about an hour, felt well, and went to work by noon.  He  had had normal bowel movements that day, as well as the rest of the week  until today at  about noon when he passed normal stool, but this was rapidly  followed by black-looking stool and then dark maroon-looking stool.  He has  had one of these maroon stools since arriving in the emergency room.  He  feels diaphoretic and a little bit weak, but is not presyncopal.  He  describes some crampy abdominal pain, but no severe visceral-type pain.   CURRENT MEDICATIONS:  1. Prilosec OTC once daily.  2. Digitek 0.25 mg daily.  3. Diltiazem extended release 180 mg daily.  4. Dilantin 200 mg twice daily.  5. Avandia 8 mg daily.  6. Aspirin 81 mg daily.  7. Metformin 500 mg p.o. daily.  8. Amaryl 2 mg daily.  9. Multivitamins once daily.  10.      Altace 5 mg daily.   ALLERGIES:  None known.   PAST MEDICAL HISTORY:  1. Chronic atrial fibrillation for at least 14 years.  2. Diabetes mellitus since the year 2000.  3. Spontaneous carotid artery dissection.  At the time, he was on Coumadin,     but he no longer takes this medication.  4. History of hyperlipidemia and elevated triglycerides.  5. History of proteinuria.  6. Gastrointestinal reflux disease.  7. Diverticulosis.  8. History of GI bleeds as above.   PAST SURGICAL HISTORY:  1. Status post surgery to address pilonidal cyst, remotely.  2. Status post cataract surgery bilaterally, the  last of which was 10 years     ago.  3. Status post diagnostic cardiac catheterization probably seven or more     years ago.  Study apparently normal per patient report.   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  He has two adult  children from his first marriage and two stepchildren from his second  marriage.  He [place] his wife [place]  He does not  smoke or drink.   FAMILY HISTORY:  He has a 35 year old daughter who has irritable bowel  syndrome and a 85 year old daughter who has Crohn's disease.   REVIEW OF SYSTEMS:  As above.  He denies history of prostate disease.  Denies nose bleeds, oral bleeding, or easy bruising.  Denies weight loss or  poor appetite.  No nausea or vomiting.  Other review of systems is negative.   PHYSICAL EXAMINATION:  VITAL SIGNS:  Blood pressure 108/70, pulse 70,  respirations 20, temperature 97.8 degrees.  GENERAL APPEARANCE:  The patient is alert and in no apparent distress.  He  is pleasant and is a good historian.  HEENT:  No pallor.  Extraocular movements intact.  Oropharynx:  Mucous  membranes are moist and clear.  Dentition in good repair.  NECK:  There are no bruits, masses, or thyromegaly.  CHEST:  Clear to auscultation and percussion bilaterally.  CORONARY:  There is an irregularly irregular heart rate, which is not  tachycardic.  No murmurs, rubs, or gallops.  ABDOMEN:  Soft, nontender, and nondistended.  No masses.  No  hepatosplenomegaly.  RECTAL:  There is maroon stool which is not melenic type.  It is more of a  lower type origin.  No masses.  No tenderness.  EXTREMITIES:  No cyanosis, clubbing, or edema.  NEUROLOGIC:  He is alert and oriented x 3.  There is no tremor.  Grip and  pedal strength are 5/5 bilaterally.  He is mentating clearly.  DERMATOLOGIC:  No rashes or unusual sores.  HEMATOLOGIC:  No obvious bruising.   LABORATORIES:  The hemoglobin  is 14.3, hematocrit 41.4, platelets  213,000, and white blood cell count 7.9.  The PT is 13.  The INR is 1.0.  The BUN is  19 and creatinine 1.2.  Sodium 134, potassium 4.9.  The glucose is elevated  at 303.   ASSESSMENT:  1. Gastrointestinal bleed.  Question if this is a diverticular source or     secondary to an as of yet undiagnosed lesion in the small bowel.  Very     unlikely that this is an upper GI lesion given chronic proton pump     inhibitors, though he does take a baby aspirin every day, so this is     still within the realm of possibility.  However, his BUN is within normal     limits.  2. History of seizures and syncope notably when he has had GI bleeds, but he     also has had these before, notably in November of 2004.  He has had some     sort of abnormality on an EEG this year and is on Dilantin.  Dr. Sandria Manly     follows him.  3. Chronic atrial fibrillation with no symptoms referable to cardiac     pathology.  4. Diabetes mellitus, type 2, with notably elevated glucose.   PLAN:  The patient is to be admitted to telemetry bed for IV fluids, clear  liquids, and a tagged red blood cell scan.  Will obtain serial H&Hs and  transfuse if needed.  Will initiate sliding scale insulin as well, but will  probably hold on oral agents.      Jennye Moccasin, P.A. [facility]                   Lina Sar, M.D. [facility]    SG/MEDQ  D:  12/19/2003  T:  12/19/2003  Job:  [id number]

## 2011-01-22 NOTE — Discharge Summary (Signed)
NAME:  Mitchell Bright, Mitchell Bright                          ACCOUNT NO.:  [id number]   MEDICAL RECORD NO.:  [id number]                   PATIENT TYPE:  INP   LOCATION:  4710                                 FACILITY:  [facility]   PHYSICIAN:  Ollen Gross. Vernell Morgans, M.D.              DATE OF BIRTH:  [birth year]   DATE OF ADMISSION:  12/19/2003  DATE OF DISCHARGE:  12/31/2003                                 DISCHARGE SUMMARY   Mitchell Bright is a 59 year old white male who was admitted with a GI bleed.  He  underwent an extensive workup that found only some diverticular disease of  his left colon that was felt to be the source.  He continued to have bleeds  and lightheadedness during the hospitalization, and, on December 25, 2003, was  taken to the operating room for a left hemicolectomy.  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  He tolerated this well.  He had no evidence of any  other disease in his abdomen or liver.  Postoperatively, he had a short  ileus requiring NG tube and bowel rest, but he quickly recovered.  His NG  tube was removed on December 29, 2003, and his diet was slowly advanced.  By  December 31, 2003, he was ready for discharge home.  He was tolerating a diet.  He had not had any more GI bleed.  His hemoglobin was stable.  His condition  at the time of discharge was stable.   FINAL DIAGNOSES:  1. Areas of left colon diverticular bleed.  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   DIET:  As tolerated.   MEDICATIONS:  1. He is to resume his home meds.  2. He was given a prescription for Vicodin for pain.   FOLLOW UP:  Dr. Carolynne Edouard in the next two weeks.   He is discharged home.                                                Ollen Gross. Vernell Morgans, M.D.    PST/MEDQ  D:  02/26/2004  T:  02/28/2004  Job:  [id number]

## 2011-01-22 NOTE — Consult Note (Signed)
NAME:  Mitchell Bright, Mitchell Bright                          ACCOUNT NO.:  [id number]   MEDICAL RECORD NO.:  [id number]                   PATIENT TYPE:  INP   LOCATION:  4710                                 FACILITY:  [facility]   PHYSICIAN:  Titus Dubin. Alwyn Ren, M.D. [facility]         DATE OF BIRTH:  21-Jul-1951   DATE OF CONSULTATION:  12/21/2003  DATE OF DISCHARGE:                                   CONSULTATION   REASON FOR CONSULTATION:  Diabetes.   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  Consultation was requested for uncontrolled  diabetes.  Sugars have been as high as 300 on occasion.   His diabetes was diagnosed in November of 2000 by an ophthalmologist, who  did implants for cataracts.  Because of the premature cataract formation,  the doctor questioned diabetes and subsequent evaluation revealed adult  onset diabetes mellitus.  His mother did have diabetes and also has coronary  artery disease.   He has been on Metformin 500 mg daily, Amaryl 2 mg daily, Avandia 8 mg 1/2  twice a day.  He attended [facility] nutrition classes, but never completed them  because of complications, missing at least two.  His diet is described as  nothing rigid, the best choices I can make.  He denies polyuria or  polyphagia.  He does have polydipsia at times.  He has nocturia x3 on  average.  His last hemoglobin A1C was in January of this year, but he is  unaware of the value.  He does not check glucoses at home, the last  measurement was three to four weeks ago, but he states it is probably in the  140's.  He has had a lesion of the left foot which has healed slowly.  He  has had intermittent itching in the feet.   At this time he is post colonoscopy and somewhat lethargic.  Pulses are  variable from 81 to 94.  O2 saturations are 100% on room air.  Blood  pressure is 113/76.  Additionally, there was a question of a faint right  carotid bruit, but on reposition of  the head this was not noted.  It is  noted that heart rate was regular.  Chest was clear.  Pedal pulses are  intact.  There is a circular hyperpigmented area over the dorsum of the foot  with no evidence of any active process.  It is well healed.   He does have uncontrolled diabetes and the goals were discussed.  It was  recommended that he check his fasting blood sugars each day with an ultimate  goal of less than 110.  Two hours after his meals, it should be checked  periodically to give some assessment of cardiovascular risk long term.  It  was recommended that he walk 40 minutes three times a week, drink at least  40 ounces a day of  water, and maintain a waste measurement less than 40  inches at the umbilicus.  It was recommended that he consider a carbohydrate-  restricted diet such as [employer] or [employer].  This will  also eliminate any reactive hypoglycemia episodes he has been having.  Hemoglobin A1C and urine will be collected.  He plans to relocate to  [facility] and will be seen by Olivia Canter, M.D. in that locale.  The  long-term complications related to denial and noncompliance were discussed  with him and his wife and daughter.                                               Titus Dubin. Alwyn Ren, M.D. [facility]    [facility]/MEDQ  D:  12/21/2003  T:  12/23/2003  Job:  [id number]

## 2011-01-22 NOTE — Assessment & Plan Note (Signed)
[facility]                            CARDIOLOGY OFFICE NOTE   HAKEEN, SHIPES                       MRN:          [id number]  DATE:08/19/2006                            DOB:          [birth year]    PRIMARY CARE PHYSICIAN:  Dr. Pearson Forster in [place].   PAST MEDICAL HISTORY:  Mr. Waas is 60 years old and has chronic atrial  fibrillation.  He also has diabetes and is now on chronic Coumadin  therapy.   He says he has done quite well, and has had no recent chest pain, no  shortness of breath, and only has rare palpitations.   PAST MEDICAL HISTORY:  Significant for diabetes and hyperlipidemia.  He  has lost about 15 pounds, and his blood sugars have come down  significantly.  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   CURRENT MEDICATIONS:  Include TriCor, Crestor, Coumadin, aspirin,  Dilantin, diltiazem, digoxin, Prilosec, Altace, multivitamins,  metformin, Byetta and __________.  He was on Avandia, and this was  discontinued.   PHYSICAL EXAMINATION:  The blood pressure was 112/70, the pulse was 71  and irregular.  There was no venous distention.  The carotid pulses were full without  bruits.  CHEST:  Clear.  HEART:  Rhythm was irregular.  I could hear no murmurs or gallops.  ABDOMEN:  Soft with normal bowel sounds.  There was no  hepatosplenomegaly.  The peripheral pulses were full, and there was no peripheral edema.   An electrocardiogram showed atrial fibrillation with a controlled rate,  and was otherwise normal.   IMPRESSION:  1. Chronic atrial fibrillation.  2. Diabetes.  3. History of previous spontaneous carotid artery dissection.  4. Hyperlipidemia.  5. Status post partial bowel resection for a carcinoid.  6. History of seizure disorder.   RECOMMENDATIONS:  I think Annette Stable is doing quite well.  His cardiac  status  is quite stable.  Dr. Tresa Endo is managing his diabetes and lipids.  We  will plan to not make any changes in his medications today.  We will  plan to see him back in followup in a year.     Bruce Elvera Lennox Juanda Chance, MD, [facility]  Electronically Signed    BRB/MedQ  DD: 08/19/2006  DT: 08/20/2006  Job #: [id number]

## 2011-01-22 NOTE — Consult Note (Signed)
NAME:  Mitchell Bright, Mitchell Bright                          ACCOUNT NO.:  [id number]   MEDICAL RECORD NO.:  [id number]                   PATIENT TYPE:  INP   LOCATION:  4710                                 FACILITY:  [facility]   PHYSICIAN:  Duke Salvia, M.D.               DATE OF BIRTH:  04-08-1951   DATE OF CONSULTATION:  12/20/2003  DATE OF DISCHARGE:                                   CONSULTATION   REASON FOR CONSULTATION:  Thank you very much for asking me to see Mitchell Bright in electrophysiological consultation for recurrent syncope.   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   The patient was admitted last night because of recurrent GI bleeding.  Last  night, after having had a bloody stool, about 1:30, he got up to have  another bloody stool at 3:30 that was associated with notable rectal  urgency.  While on the commode just having released  his stool, he became  lightheaded and lost consciousness.  He was profoundly diaphoretic.  He was  quite woozy and fatigued afterwards.  This is stereo typical response  notable on prior occasions of similar syncope.  Specifically, he had an  episode while in a hotel in [place], again associated with bright red  blood per rectum and an episode while giving blood associated with similar  recovery symptoms.   Of note, there was an episode in September that occurred while driving.  He  felt not normal.  He switched driving positions with his wife.  He reclined  his car and then proceeded to lose consciousness.  His eyes rolled back.  He  was noted to be extremely pale.  There was upper body jerking and again some  recovery  symptoms that were similar to recovery symptoms associated with the  aforementioned episode.  It should also be noted that with the episode in  [place], he also had upper body jerking.  On neurological evaluation,  apparently showed some abnormality on his frontal lobe and he was started on  Dilantin.   The patient had permanent atrial fibrillation as noted.  His exercise  tolerance for the most part has been quite good and his rates have been well  regulated.   PAST MEDICAL HISTORY:  1. Notable primarily as above.  2. He has history of diverticulosis.  3. He has history of reflux disease.  4. Dyslipidemia.   MEDICATIONS ON ADMISSION:  1. Protonix.  2. Lanoxin.  3. Cardizem 180.  4. Dilantin 200 mg b.i.d.  5. Altace.  6. Metformin.  7. Digitek.  8. Amaryl.  9. He does not take a beta blocker.   ALLERGIES:  He has no known drug allergies.   SOCIAL HISTORY:  He is married.  He has two adult children from his first  marriage and two step-children from his second marriage.   FAMILY HISTORY:  His family history is noncontributory.   PHYSICAL EXAMINATION:  GENERAL:  He is a middle-aged Caucasian male in no  acute distress.  VITAL SIGNS:  Blood pressure 122/80, pulse 88 and irregular.  HEENT:  Demonstrated no icterus or xanthoma.  NECK:  Neck veins were flat.  Carotids brisk and full bilaterally without  bruits.  BACK:  Without kyphosis, scoliosis.  LUNGS:  Clear.  HEART:  Irregular without murmurs or gallops.  ABDOMEN:  Soft with active bowel sounds without midline pulsations or  hepatomegaly.  Femoral pulses were 2+.  Distal pulses were intact.  EXTREMITIES:  No clubbing, cyanosis or edema.  NEUROLOGIC:  Grossly normal.   LABORATORY DATA:  Notable for intrahospital hemoglobin drop from about 14 to  about 10.5.  He is currently being transfused.  His blood sugar was 253.  Other laboratories were unrevealing.  His telemetry is notable for rapid  rates as well as the  bradycardia previously noted.   IMPRESSION:  1. Recurrent syncope, reflex with triggers identifiable as     A. Defecation.     B. Phlebotomy.  2. Atrial fibrillation, permanent, with presumably a well controlled     ventricular response at baseline.  3. Thromboembolic risk factors notable for:     A. Diabetes.     B. Question of prior transient ischemic attack.  4. Gastrointestinal bleeding, question cause.  5. Question seizure disorder.   Mitchell Bright has reflex syncope that is neurally mediated with triggers  including blood loss, probably with associated intravascular volume  depletion as well as phlebotomy.  Apparently he has a remote history in his  youth as well and so is susceptible to these neural reflexes.   The issue of concern to me is the identifiable triggers, especially given  the episode that he had in the car in September for which a trigger is not  clear and yet this happened in a recumbent/seated position.   Treatment options would include beta blockers, prevention of sympathetic  withdrawal with something like Yohimbine, potentially pacing for rate drop  response.  The role of these things in reflex syncope that may be spinal  cord mediated is a little bit harder for me to know.  More simple things  would be to avoid triggering situations; i.e. phlebotomy and if so, to do it  in a recumbent position and to be aware of the potential for defecation  syncope in the setting of GI blood loss, potentially using yohimbine p.r.n.  and to maintain an adequate hydration status once bleeding has commenced.   Again, the issue of what happened in September begs further answers which I  do not have access to currently.   The other issue is his atrial fibrillation with thromboembolic risk factors.  His thromboembolic risk factors, his annualized his risk for stroke is quite high.  Things that could be done to potentially reduce that stroke risk  might be fibrillation ablation  although I do not know what the likelihood of  success is in a 60 year old permanent atrial fibrillation situation as well  as wait and see what happens with the percutaneous left atrial occluder  device.   RECOMMENDATIONS:  1. For now  I would want to maintain volume status.  Will replenish.  2. Avoid phlebotomy in anything but a recumbent position.  3. Will discuss with Dr. Juanda Chance the roles of yohimbine and/or pacing to help     mitigate some of his symptoms.   Thank you for this consultation.                                               Duke Salvia, M.D.    SCK/MEDQ  D:  12/20/2003  T:  12/22/2003  Job:  [id number]   cc:   Titus Dubin. Alwyn Ren, M.D. [facility]   Charlies Constable, M.D. [facility]

## 2011-01-22 NOTE — Op Note (Signed)
NAME:  Mitchell Bright, Mitchell Bright                          ACCOUNT NO.:  [id number]   MEDICAL RECORD NO.:  [id number]                   PATIENT TYPE:  INP   LOCATION:  4710                                 FACILITY:  [facility]   PHYSICIAN:  Ollen Gross. Vernell Morgans, M.D.              DATE OF BIRTH:  Jan 20, 1951   DATE OF PROCEDURE:  12/25/2003  DATE OF DISCHARGE:                                 OPERATIVE REPORT   PREOPERATIVE DIAGNOSIS:  Probable lower gastrointestinal bleed.   POSTOPERATIVE DIAGNOSES:  1. Probable lower gastrointestinal bleed.  2. Carcinoid of the small bowel.   PROCEDURE:  Exploratory laparotomy, left and sigmoid colectomy including the  splenic flexure and segmental small bowel resection.   SURGEON:  Ollen Gross. Carolynne Edouard, M.D.   ASSISTANT:  Sharlet Salina T. Hoxworth, M.D.   ANESTHESIA:  General endotracheal.   PROCEDURE:  After informed consent was obtained, the patient was brought to  the operating room and placed in the supine position on the operating room  table.  After adequate induction of general anesthesia, the patient was  placed in the lithotomy position.  His abdomen was then prepped with  Betadine and draped in the usual sterile manner.  A midline incision was  made with a 10 blade knife.  This incision was carried down through the skin  and subcutaneous tissue sharply with the electrocautery until the linea alba  was identified.  The linea alba was also incised with the electrocautery.  The preperitoneal space was then probed bluntly with a hemostat until the  peritoneum was opened and access was gained to the abdominal cavity.  The  rest of the incision was then opened under direct vision.  A Balfour  retractor was used to retract the sidewalls of the abdomen laterally.   Initially the sigmoid colon was inspected.  There was some moderate benign-  appearing diverticula of the sigmoid colon.  There appeared to be a few  diverticula that also extended up the left colon.  It was felt  that to  remove this diverticular disease, it would be necessary to remove the entire  left colon because the exact source of the bleed was unknown.  Prior to  embarking on the colon resection, the small bowel was run from the ligament  of Treitz to the ileocecal valve.  In the distal small bowel there was a  small nodular identified with a fairly desmoplastic reaction around it that  seemed to be in the wall of the small bowel.  A good margin proximally and  distally was chosen and the mesentery at each of these points proximal and  distal to this point was opened with the electrocautery.  GIA-75 stapler was  then placed across the bowel at each of these points, clamped and fired,  thereby dividing the bowel between staple lines.  The mesentery of this  segment of small bowel was then also taken down  by serially clamping with  Kelly clamps, dividing and ligating with 2-0 silk ties each of the vessels  within the mesentery.  Once this was accomplished, the specimen was freed  from the patient and sent to pathology for frozen section evaluation.  The  antimesenteric border of the small bowel proximally and distally was then  reapproximated with interrupted 3-0 silk stitches.  The antimesenteric  corner of each of the staple lines was then sharply excised with a heavy  scissors.  Each limb of the GIA stapler was then placed down the according  limb of bowel, clamped and fired thereby creating a nice widely patent  enteroenterostomy.  The enterotomy was closed with interrupted 3-0 silk  stitches and the mesenteric defect was also closed with interrupted 3-0 silk  stitches.   Next, attention was turned to the left colon.  The sigmoid and left colon  was mobilized by incising its retroperitoneal attachment along the white  line of Toldt.  Both sharp and blunt dissection in this manner was carried  up to the splenic flexure.  The colon was very closely approximated to the  inferior pole of the  spleen and required careful sharp dissection to  separate it from the inferior pole of the spleen without damaging the  spleen.  This was able to be accomplished nicely without any damage to the  spleen at all.  The transverse colon was then also mobilized by separating  it from the omentum through the avascular plane.  This allowed mobilization  of the transverse colon inferiorly.  At this point a segment for dividing  the transverse colon just at the edge of one of the major vessels supplying  the transverse colon was identified.  The mesentery at this point was opened  sharply with the electrocautery.  A GIA-75 stapler was then placed across  the bowel at this point, clamped and fired, thereby dividing the bowel  between staple lines.  The mesentery then of the left colon including the  splenic flexure was then taken down by serially clamping the mesenteric  vessels in this segment of bowel, dividing and ligating with 2-0 silk ties.   Once this was accomplished down to the sigmoid colon, a site was chosen  where there were no more diverticula and the mesentery at this point on the  bowel was opened sharply with the electrocautery.  The GIA-75 stapler was  placed across the bowel at this point, clamped and fired, thereby dividing  the bowel between staple lines and the rest of the mesentery of the sigmoid  colon was also taken down by serially clamping, dividing and ligating with 2-  0 silk ties in each of the mesenteric vessels.   Once this was accomplished, the transverse colon was mobilized a little more  by taking down the rest of its attachment to the omentum.  Once this was  accomplished, the transverse colon easily reached down to the rectosigmoid  stump.  A side of the transverse colon to end of the rectosigmoid  anastomosis was created by excising the staple line on the rectosigmoid and  then creating an enterotomy on the antimesenteric border of the transverse colon along a  tinea.  The anastomosis was created using full-thickness 3-0  silk stitches, completing the posterior wall first and then completing the  anterior wall, keeping the knots on the inside except for the last 3-4  stitches.   Once this was accomplished, the anastomosis was widely patent and both  segments of bowel were healthy appearing.  There was no way to close the  mesenteric defect because of the long segment and so it was left open.  The  abdomen was then irrigated with copious amounts of saline.  The spleen and  liver were palpated and felt to be normal.  There were no other  abnormalities noted.  The NG tube was in good position and could be  palpated.  At this point the fascia of the anterior abdominal wall was  closed using two running #1 PDS sutures.  The subcutaneous tissue was  irrigated with copious amounts of saline.  The skin was closed with staples.  The patient tolerated the procedure well.  At the end of the case, all  needle, sponge and instrument counts were correct.  The patient was then  awakened and taken to the recovery room in stable condition.                                               Ollen Gross. Vernell Morgans, M.D.    PST/MEDQ  D:  12/29/2003  T:  12/30/2003  Job:  [id number]

## 2011-03-12 ENCOUNTER — Encounter: Payer: Self-pay | Admitting: Cardiology

## 2012-12-12 DIAGNOSIS — Z8673 Personal history of transient ischemic attack (TIA), and cerebral infarction without residual deficits: Secondary | ICD-10-CM | POA: Insufficient documentation

## 2014-04-23 DIAGNOSIS — M75100 Unspecified rotator cuff tear or rupture of unspecified shoulder, not specified as traumatic: Secondary | ICD-10-CM | POA: Insufficient documentation

## 2015-03-20 DIAGNOSIS — Z8719 Personal history of other diseases of the digestive system: Secondary | ICD-10-CM | POA: Insufficient documentation

## 2015-10-22 DIAGNOSIS — E1142 Type 2 diabetes mellitus with diabetic polyneuropathy: Secondary | ICD-10-CM | POA: Diagnosis present

## 2015-10-22 DIAGNOSIS — G40209 Localization-related (focal) (partial) symptomatic epilepsy and epileptic syndromes with complex partial seizures, not intractable, without status epilepticus: Secondary | ICD-10-CM | POA: Diagnosis present

## 2019-10-16 ENCOUNTER — Ambulatory Visit (INDEPENDENT_AMBULATORY_CARE_PROVIDER_SITE_OTHER): Payer: Medicare Other | Admitting: Otolaryngology

## 2019-10-29 DIAGNOSIS — H6993 Unspecified Eustachian tube disorder, bilateral: Secondary | ICD-10-CM | POA: Insufficient documentation

## 2019-10-29 DIAGNOSIS — J310 Chronic rhinitis: Secondary | ICD-10-CM | POA: Insufficient documentation

## 2021-08-25 DIAGNOSIS — H9193 Unspecified hearing loss, bilateral: Secondary | ICD-10-CM | POA: Insufficient documentation

## 2021-12-31 DIAGNOSIS — M4150 Other secondary scoliosis, site unspecified: Secondary | ICD-10-CM | POA: Insufficient documentation

## 2022-03-25 DIAGNOSIS — Z9889 Other specified postprocedural states: Secondary | ICD-10-CM | POA: Insufficient documentation

## 2022-06-15 DIAGNOSIS — B957 Other staphylococcus as the cause of diseases classified elsewhere: Secondary | ICD-10-CM | POA: Insufficient documentation

## 2022-06-18 DIAGNOSIS — M4626 Osteomyelitis of vertebra, lumbar region: Secondary | ICD-10-CM | POA: Diagnosis present

## 2022-07-08 ENCOUNTER — Inpatient Hospital Stay (HOSPITAL_COMMUNITY): Payer: Medicare Other

## 2022-07-08 ENCOUNTER — Inpatient Hospital Stay (HOSPITAL_COMMUNITY)
Admission: EM | Admit: 2022-07-08 | Discharge: 2022-07-13 | DRG: 394 | Disposition: A | Discharge status: Skilled Nursing Facility | Payer: Medicare Other | Attending: Internal Medicine | Admitting: Internal Medicine

## 2022-07-08 ENCOUNTER — Encounter (HOSPITAL_COMMUNITY): Payer: Self-pay

## 2022-07-08 ENCOUNTER — Emergency Department (HOSPITAL_COMMUNITY): Payer: Medicare Other

## 2022-07-08 ENCOUNTER — Other Ambulatory Visit: Payer: Self-pay

## 2022-07-08 DIAGNOSIS — G40209 Localization-related (focal) (partial) symptomatic epilepsy and epileptic syndromes with complex partial seizures, not intractable, without status epilepticus: Secondary | ICD-10-CM | POA: Diagnosis present

## 2022-07-08 DIAGNOSIS — E1142 Type 2 diabetes mellitus with diabetic polyneuropathy: Secondary | ICD-10-CM | POA: Diagnosis present

## 2022-07-08 DIAGNOSIS — Z8673 Personal history of transient ischemic attack (TIA), and cerebral infarction without residual deficits: Secondary | ICD-10-CM | POA: Diagnosis not present

## 2022-07-08 DIAGNOSIS — E1169 Type 2 diabetes mellitus with other specified complication: Secondary | ICD-10-CM | POA: Diagnosis present

## 2022-07-08 DIAGNOSIS — E872 Acidosis, unspecified: Secondary | ICD-10-CM | POA: Diagnosis present

## 2022-07-08 DIAGNOSIS — Z79899 Other long term (current) drug therapy: Secondary | ICD-10-CM

## 2022-07-08 DIAGNOSIS — K460 Unspecified abdominal hernia with obstruction, without gangrene: Secondary | ICD-10-CM | POA: Diagnosis present

## 2022-07-08 DIAGNOSIS — I10 Essential (primary) hypertension: Secondary | ICD-10-CM | POA: Diagnosis present

## 2022-07-08 DIAGNOSIS — E7800 Pure hypercholesterolemia, unspecified: Secondary | ICD-10-CM | POA: Diagnosis present

## 2022-07-08 DIAGNOSIS — E119 Type 2 diabetes mellitus without complications: Secondary | ICD-10-CM

## 2022-07-08 DIAGNOSIS — Z794 Long term (current) use of insulin: Secondary | ICD-10-CM

## 2022-07-08 DIAGNOSIS — Z888 Allergy status to other drugs, medicaments and biological substances status: Secondary | ICD-10-CM

## 2022-07-08 DIAGNOSIS — Z7901 Long term (current) use of anticoagulants: Secondary | ICD-10-CM

## 2022-07-08 DIAGNOSIS — R791 Abnormal coagulation profile: Secondary | ICD-10-CM | POA: Diagnosis present

## 2022-07-08 DIAGNOSIS — I700 Atherosclerosis of aorta: Secondary | ICD-10-CM | POA: Diagnosis present

## 2022-07-08 DIAGNOSIS — K8020 Calculus of gallbladder without cholecystitis without obstruction: Secondary | ICD-10-CM | POA: Diagnosis present

## 2022-07-08 DIAGNOSIS — K56609 Unspecified intestinal obstruction, unspecified as to partial versus complete obstruction: Secondary | ICD-10-CM | POA: Diagnosis present

## 2022-07-08 DIAGNOSIS — N400 Enlarged prostate without lower urinary tract symptoms: Secondary | ICD-10-CM | POA: Diagnosis present

## 2022-07-08 DIAGNOSIS — M4626 Osteomyelitis of vertebra, lumbar region: Secondary | ICD-10-CM | POA: Diagnosis present

## 2022-07-08 DIAGNOSIS — I482 Chronic atrial fibrillation, unspecified: Secondary | ICD-10-CM | POA: Diagnosis present

## 2022-07-08 DIAGNOSIS — I4891 Unspecified atrial fibrillation: Secondary | ICD-10-CM | POA: Diagnosis present

## 2022-07-08 HISTORY — DX: Benign carcinoid tumor of unspecified site: D3A.00

## 2022-07-08 LAB — PROTIME-INR
INR: 3.5 — ABNORMAL HIGH (ref 0.8–1.2)
Prothrombin Time: 35 seconds — ABNORMAL HIGH (ref 11.4–15.2)

## 2022-07-08 LAB — COMPREHENSIVE METABOLIC PANEL
ALT: 17 U/L (ref 0–44)
AST: 19 U/L (ref 15–41)
Albumin: 3.3 g/dL — ABNORMAL LOW (ref 3.5–5.0)
Alkaline Phosphatase: 74 U/L (ref 38–126)
Anion gap: 18 — ABNORMAL HIGH (ref 5–15)
BUN: 31 mg/dL — ABNORMAL HIGH (ref 8–23)
CO2: 20 mmol/L — ABNORMAL LOW (ref 22–32)
Calcium: 8.9 mg/dL (ref 8.9–10.3)
Chloride: 99 mmol/L (ref 98–111)
Creatinine, Ser: 0.84 mg/dL (ref 0.61–1.24)
GFR, Estimated: >60 mL/min (ref >60)
Glucose, Bld: 133 mg/dL — ABNORMAL HIGH (ref 70–99)
Potassium: 4.2 mmol/L (ref 3.5–5.1)
Sodium: 137 mmol/L (ref 135–145)
Total Bilirubin: 1.5 mg/dL — ABNORMAL HIGH (ref 0.3–1.2)
Total Protein: 6.9 g/dL (ref 6.5–8.1)

## 2022-07-08 LAB — CBC WITH DIFFERENTIAL/PLATELET
Abs Immature Granulocytes: 0.05 10*3/uL (ref 0.00–0.07)
Basophils Absolute: 0.1 10*3/uL (ref 0.0–0.1)
Basophils Relative: 0 %
Eosinophils Absolute: 0.1 10*3/uL (ref 0.0–0.5)
Eosinophils Relative: 1 %
HCT: 47.3 % (ref 39.0–52.0)
Hemoglobin: 15 g/dL (ref 13.0–17.0)
Immature Granulocytes: 0 %
Lymphocytes Relative: 14 %
Lymphs Abs: 1.9 10*3/uL (ref 0.7–4.0)
MCH: 28.7 pg (ref 26.0–34.0)
MCHC: 31.7 g/dL (ref 30.0–36.0)
MCV: 90.6 fL (ref 80.0–100.0)
Monocytes Absolute: 0.8 10*3/uL (ref 0.1–1.0)
Monocytes Relative: 6 %
Neutro Abs: 11 10*3/uL — ABNORMAL HIGH (ref 1.7–7.7)
Neutrophils Relative %: 79 %
Platelets: 364 10*3/uL (ref 150–400)
RBC: 5.22 MIL/uL (ref 4.22–5.81)
RDW: 14.4 % (ref 11.5–15.5)
WBC: 13.8 10*3/uL — ABNORMAL HIGH (ref 4.0–10.5)
nRBC: 0 % (ref 0.0–0.2)

## 2022-07-08 LAB — LACTIC ACID, PLASMA
Lactic Acid, Venous: 1.7 mmol/L (ref 0.5–1.9)
Lactic Acid, Venous: 1.4 mmol/L (ref 0.5–1.9)

## 2022-07-08 LAB — DIGOXIN LEVEL: Digoxin Level: 0.2 ng/mL — ABNORMAL LOW (ref 0.8–2.0)

## 2022-07-08 LAB — MAGNESIUM: Magnesium: 2 mg/dL (ref 1.7–2.4)

## 2022-07-08 LAB — LIPASE, BLOOD: Lipase: 24 U/L (ref 11–51)

## 2022-07-08 LAB — GLUCOSE, CAPILLARY: Glucose-Capillary: 94 mg/dL (ref 70–99)

## 2022-07-08 LAB — PHENYTOIN LEVEL, TOTAL: Phenytoin Lvl: <2.5 ug/mL — ABNORMAL LOW (ref 10.0–20.0)

## 2022-07-08 MED ORDER — LORAZEPAM 2 MG/ML IJ SOLN
1.0000 mg | Freq: Once | INTRAMUSCULAR | Status: AC
  Administered 2022-07-08: 1 mg via INTRAVENOUS
  Filled 2022-07-08: qty 1

## 2022-07-08 MED ORDER — SODIUM CHLORIDE 0.9 % IV SOLN
500.0000 mg | INTRAVENOUS | Status: DC
  Administered 2022-07-09 – 2022-07-13 (×5): 500 mg via INTRAVENOUS
  Filled 2022-07-08 (×5): qty 10

## 2022-07-08 MED ORDER — HYDROMORPHONE HCL 1 MG/ML IJ SOLN
0.5000 mg | Freq: Once | INTRAMUSCULAR | Status: AC
  Administered 2022-07-08: 0.5 mg via INTRAVENOUS
  Filled 2022-07-08: qty 1

## 2022-07-08 MED ORDER — DIATRIZOATE MEGLUMINE & SODIUM 66-10 % PO SOLN
90.0000 mL | Freq: Once | ORAL | Status: AC
  Administered 2022-07-08: 90 mL via NASOGASTRIC
  Filled 2022-07-08: qty 90

## 2022-07-08 MED ORDER — LACTATED RINGERS IV SOLN: INTRAVENOUS | Status: DC

## 2022-07-08 MED ORDER — LAMOTRIGINE 100 MG PO TABS: 100.0000 mg | ORAL_TABLET | Freq: Every day | ORAL | Status: DC

## 2022-07-08 MED ORDER — VITAMIN K1 10 MG/ML IJ SOLN
10.0000 mg | Freq: Once | INTRAVENOUS | Status: AC
  Administered 2022-07-08: 10 mg via INTRAVENOUS
  Filled 2022-07-08: qty 1

## 2022-07-08 MED ORDER — PANTOPRAZOLE SODIUM 40 MG IV SOLR
40.0000 mg | INTRAVENOUS | Status: DC
  Administered 2022-07-08 – 2022-07-10 (×3): 40 mg via INTRAVENOUS
  Filled 2022-07-08 (×3): qty 10

## 2022-07-08 MED ORDER — LACTATED RINGERS IV BOLUS
1000.0000 mL | Freq: Once | INTRAVENOUS | Status: AC
  Administered 2022-07-08: 1000 mL via INTRAVENOUS

## 2022-07-08 MED ORDER — IOHEXOL 300 MG/ML  SOLN
100.0000 mL | Freq: Once | INTRAMUSCULAR | Status: AC | PRN
  Administered 2022-07-08: 100 mL via INTRAVENOUS

## 2022-07-08 MED ORDER — SODIUM CHLORIDE (PF) 0.9 % IJ SOLN
INTRAMUSCULAR | Status: AC
  Filled 2022-07-08: qty 50

## 2022-07-08 MED ORDER — ACETAMINOPHEN 650 MG RE SUPP: 650.0000 mg | Freq: Four times a day (QID) | RECTAL | Status: DC | PRN

## 2022-07-08 MED ORDER — ONDANSETRON HCL 4 MG PO TABS: 4.0000 mg | ORAL_TABLET | Freq: Four times a day (QID) | ORAL | Status: DC | PRN

## 2022-07-08 MED ORDER — HYDROMORPHONE HCL 1 MG/ML IJ SOLN
0.5000 mg | INTRAMUSCULAR | Status: DC | PRN
  Administered 2022-07-08: 0.5 mg via INTRAVENOUS
  Filled 2022-07-08: qty 0.5

## 2022-07-08 MED ORDER — ACETAMINOPHEN 325 MG PO TABS: 650.0000 mg | ORAL_TABLET | Freq: Four times a day (QID) | ORAL | Status: DC | PRN

## 2022-07-08 MED ORDER — ONDANSETRON HCL 4 MG/2ML IJ SOLN: 4.0000 mg | Freq: Four times a day (QID) | INTRAMUSCULAR | Status: DC | PRN

## 2022-07-08 NOTE — H&P (Signed)
History and Physical    Patient: Mitchell Bright DOB: 03/18/1951 DOA: 07/08/2022 DOS: the patient was seen and examined on 07/08/2022 PCP: Ivan Anchors, MD  Patient coming from: Home  Chief Complaint:  Chief Complaint  Patient presents with   Abdominal Pain   HPI: Mitchell Bright is a 71 y.o. 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  He recently had laminectomy, UTI, Staph epidermidis bacteremia and osteomyelitis of the spine and is currently on daptomycin.  He was sent to rehab after having abdominal pain with emesis for the past 3 days.  He has had 4 episodes today.  His last bowel movement was yesterday after he was given a suppository and an enema, but stated he was constipated for 3-4 days prior to that. He denied fever, chills, rhinorrhea, sore throat, wheezing or hemoptysis.  No chest pain, palpitations, diaphoresis, PND, orthopnea or pitting edema of the lower extremities.  No diarrhea, melena or hematochezia.  No flank pain, dysuria, frequency or hematuria.  No polyuria, polydipsia, polyphagia or blurred vision.   ED course: Initial vital signs were temperature 97.7 F, pulse 121, respirations 16, BP 140/104 mmHg O2 sat 100% on room air.  The patient received hydromorphone 0.5 mg IVP, LR 1000 mL bolus and lorazepam 1 mg IVP prior to NG tube placement.  Lab work: CBC*white count 13.8 with 79% neutrophils, hemoglobin 15.0 g deciliter platelets 364.  PT was 35.0 and INR 3.5.  Lipase, magnesium and lactic acid were normal. CMP showed a glucose of 133, BUN of 31 and normal creatinine of 0.84 mg/dL. CO2 was 20 mmol/L with an anion gap of 18.  The rest of the  electrolytes and creatinine were normal.  LFTs with an albumin of 3.3 g/dL and total bilirubin 1.5 mg/dL.  The rest of the LFTs were normal.  Imaging: CT abdomen/pelvis showed a high-grade small bowel obstruction with transition point in the right upper abdomen where there is a satellite collection of bowel loops suspicious for internal hernia.  No pneumatosis or extraluminal gas.  There is abnormal bony destructive findings along the L1-L2 intervertebral disc space which is substantially widening with some flattening and reduced height of L1 to a lesser degree of L2 concerning for osteomyelitis.  Cholelithiasis.  Prostatomegaly.  Borderline cardiomegaly.  Aortic atherosclerosis.  Please see images and full brotherly report for further details.   Review of Systems: As mentioned in the history of present illness. All other systems reviewed and are negative. Past Medical History:  Diagnosis Date   Atrial fibrillation [facility])    chronic   Carcinoid tumor    Carotid artery dissection (HCC)    History of previous spontaneous carotid artery dissection   Diabetes mellitus    Hyperlipidemia    S/P small bowel resection    History of a partial bowel resection for a carcinoid   Seizure disorder [facility])    Past Surgical History:  Procedure Laterality Date   PARTIAL COLECTOMY     left, sigmoid colectomy by Dr. Marlou Starks in 2005   [place] History:  reports that he has never smoked. He has never used smokeless tobacco. He reports current alcohol use. No history on file for  drug use.  Allergies  Allergen Reactions   Oseltamivir Phosphate     History reviewed. No pertinent family history.  Prior to Admission medications   Medication Sig Start Date End Date Taking? Authorizing Provider  digoxin (LANOXIN) 0.25 MG tablet Take 250 mcg by mouth daily.      [provider]  diltiazem (DILACOR XR) 180 MG 24 hr capsule Take 180 mg by mouth daily.      [provider]   fenofibrate (TRICOR) 145 MG tablet Take 145 mg by mouth daily.      [provider]  insulin glargine (LANTUS) 100 UNIT/ML injection as directed.      [provider]  metFORMIN (GLUCOPHAGE) 500 MG tablet Take 1,000 mg by mouth 2 (two) times daily with a meal.      [provider]  Phenytoin (DILANTIN PO) Take 2 tablets by mouth 2 (two) times daily.      [provider]  ramipril (ALTACE) 10 MG tablet Take 10 mg by mouth daily.      [provider]  rosuvastatin (CRESTOR) 10 MG tablet Take 10 mg by mouth daily.      [provider]  warfarin (COUMADIN) 5 MG tablet Use as directed by Anticoagulation Clinic     [provider]    Physical Exam: Vitals:   07/08/22 1330 07/08/22 1500 07/08/22 1600 07/08/22 1804  BP: (!) 136/90  (!) 143/99 (!) 158/109  Pulse: (!) 104  (!) 104 (!) 104  Resp: '17  17 18  '$ Temp:  97.7 F (36.5 C) 98 F (36.7 C)   TempSrc:      SpO2: 96%  98% 99%  Weight:    71.9 kg  Height:    '5\' 10"'$  (1.778 m)   Physical Exam Vitals and nursing note reviewed.  Constitutional:      General: He is awake. He is not in acute distress.    Appearance: He is ill-appearing.     Comments: NG tube in place.  HENT:     Head: Normocephalic.     Nose: No rhinorrhea.     Mouth/Throat:     Mouth: Mucous membranes are dry.  Eyes:     General: No scleral icterus.    Pupils: Pupils are equal, round, and reactive to light.  Neck:     Vascular: No JVD.  Cardiovascular:     Rate and Rhythm: Tachycardia present. Rhythm irregularly irregular.     Heart sounds: S1 normal and S2 normal.  Pulmonary:     Effort: Pulmonary effort is normal.     Breath sounds: Normal breath sounds. No wheezing, rhonchi or rales.  Abdominal:     General: Bowel sounds are decreased. There is no distension.     Palpations: Abdomen is soft.     Tenderness: There is generalized abdominal tenderness. There is no guarding or rebound.   Musculoskeletal:     Cervical back: Neck supple.     Right lower leg: No edema.     Left lower leg: No edema.  Skin:    General: Skin is warm and dry.  Neurological:     General: No focal deficit present.     Mental Status: He is alert and oriented to person, place, and time.  Psychiatric:        Mood and Affect: Mood normal.        Behavior: Behavior normal. Behavior is cooperative.    Data Reviewed:  There are no new results  to review at this time.  Assessment and Plan: Principal Problem:   SBO (small bowel obstruction) (HCC) Observation/MedSurg. Keep NPO. Continue IV fluids. Analgesics as needed. Antiemetics as needed. Pantoprazole 40 mg IVP every 24 hours. Keep electrolytes optimized. Follow-up CBC and CMP in AM. Follow-up imaging in the morning. General surgery input appreciated.  Active Problems:   Nonintractable epilepsy with complex partial seizures ([place])  IV phenytoin per pharmacy dosing while NPO. Check phenytoin level.    HYPERCHOLESTEROLEMIA Hold fenofibrate and Crestor for now.    Chronic atrial fibrillation CHA2DS2-VASc Score of at least 5. Off anticoagulation. Check digoxin level. Dose digoxin IV if still n.p.o. in AM.    Type 2 diabetes mellitus ([place])  Currently NPO. CBG monitoring every 6 hours.    Diabetic peripheral neuropathy (HCC) Analgesics as needed. Diabetes control.    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[place]) We will continue with daptomycin per pharmacy dosing. Analgesics as needed. Follow-up with neurosurgery and ID as an outpatient.      Advance Care Planning:   Code Status: Full Code   Consults: [place] surgery.  Family Communication:   Severity of Illness: The appropriate patient status for this patient is INPATIENT. Inpatient status is judged to be reasonable and necessary in order to provide the required intensity of service to ensure the patient's safety. The patient's presenting symptoms, physical exam  findings, and initial radiographic and laboratory data in the context of their chronic comorbidities is felt to place them at high risk for further clinical deterioration. Furthermore, it is not anticipated that the patient will be medically stable for discharge from the hospital within 2 midnights of admission.   * I certify that at the point of admission it is my clinical judgment that the patient will require inpatient hospital care spanning beyond 2 midnights from the point of admission due to high intensity of service, high risk for further deterioration and high frequency of surveillance required.*  Author: Reubin Milan, MD 07/08/2022 6:27 PM  For on call review [email or web address].   This document was prepared using Dragon voice recognition software and may contain some unintended transcription errors.

## 2022-07-08 NOTE — ED Provider Notes (Signed)
[facility] DEPT Provider Note   CSN: [id number] Arrival date & time: 07/08/22  1115     History  Chief Complaint  Patient presents with   Abdominal Pain    Mitchell Bright is a 71 y.o. male.  HPI 71 year old male presents from his rehab facility needing a CT of the abdomen and pelvis.  He has been dealing with upper abdominal pain over the last week or so, particularly over the last few days.  He is currently receiving IV antibiotics through a PICC line for a spine infection from a urinary tract infection.  He had an ultrasound performed this morning and the report concludes that he has cholelithiasis with a nonspecific amount of fluid in the left upper abdomen.  He thinks he has had a low-grade temperature of 99.  He had vomiting including this morning.  Right now his pain is rated as severe.  Home Medications Prior to Admission medications   Medication Sig Start Date End Date Taking? Authorizing Provider  digoxin (LANOXIN) 0.25 MG tablet Take 250 mcg by mouth daily.      [provider]  diltiazem (DILACOR XR) 180 MG 24 hr capsule Take 180 mg by mouth daily.      [provider]  fenofibrate (TRICOR) 145 MG tablet Take 145 mg by mouth daily.      [provider]  insulin glargine (LANTUS) 100 UNIT/ML injection as directed.      [provider]  metFORMIN (GLUCOPHAGE) 500 MG tablet Take 1,000 mg by mouth 2 (two) times daily with a meal.      [provider]  Phenytoin (DILANTIN PO) Take 2 tablets by mouth 2 (two) times daily.      [provider]  ramipril (ALTACE) 10 MG tablet Take 10 mg by mouth daily.      [provider]  rosuvastatin (CRESTOR) 10 MG tablet Take 10 mg by mouth daily.      [provider]  warfarin (COUMADIN) 5 MG tablet Use as directed by Anticoagulation Clinic     [provider]      Allergies    Oseltamivir phosphate    Review of Systems    Review of Systems  Constitutional:  Negative for fever (low grade, 99).  Gastrointestinal:  Positive for abdominal distention, abdominal pain and vomiting.    Physical Exam Updated Vital Signs BP (!) 136/90   Pulse (!) 104   Temp 97.7 F (36.5 C)   Resp 17   SpO2 96%  Physical Exam Vitals and nursing note reviewed.  Constitutional:      Appearance: He is well-developed.  HENT:     Head: Normocephalic and atraumatic.  Cardiovascular:     Rate and Rhythm: Regular rhythm. Tachycardia present.     Heart sounds: Normal heart sounds.  Pulmonary:     Effort: Pulmonary effort is normal.     Breath sounds: Normal breath sounds.  Abdominal:     Palpations: Abdomen is soft.     Tenderness: There is abdominal tenderness in the right upper quadrant, epigastric area, left upper quadrant and left lower quadrant.  Skin:    General: Skin is warm and dry.  Neurological:     Mental Status: He is alert.     ED Results / Procedures / Treatments   Labs (all labs ordered are listed, but only abnormal results are displayed) Labs Reviewed  CBC WITH DIFFERENTIAL/PLATELET - Abnormal; Notable for the following components:  Result Value   WBC 13.8 (*)    Neutro Abs 11.0 (*)    All other components within normal limits  PROTIME-INR - Abnormal; Notable for the following components:   Prothrombin Time 35.0 (*)    INR 3.5 (*)    All other components within normal limits  COMPREHENSIVE METABOLIC PANEL - Abnormal; Notable for the following components:   CO2 20 (*)    Glucose, Bld 133 (*)    BUN 31 (*)    Albumin 3.3 (*)    Total Bilirubin 1.5 (*)    Anion gap 18 (*)    All other components within normal limits  LIPASE, BLOOD  LACTIC ACID, PLASMA  LACTIC ACID, PLASMA    EKG None  Radiology CT ABDOMEN PELVIS W CONTRAST  Result Date: 07/08/2022 CLINICAL DATA:  Mid abdominal pain for about 1 month. Reportedly gallstones were found on an outside ultrasound this morning. EXAM: CT  ABDOMEN AND PELVIS WITH CONTRAST TECHNIQUE: Multidetector CT imaging of the abdomen and pelvis was performed using the standard protocol following bolus administration of intravenous contrast. RADIATION DOSE REDUCTION: This exam was performed according to the departmental dose-optimization program which includes automated exposure control, adjustment of the mA and/or kV according to patient size and/or use of iterative reconstruction technique. CONTRAST:  160m OMNIPAQUE IOHEXOL 300 MG/ML  SOLN COMPARISON:  CT scan 05/20/2006 FINDINGS: Lower chest: Bandlike atelectasis or scarring in both lower lobes. Borderline cardiomegaly. Right coronary artery atherosclerotic calcification. Hepatobiliary: 1.4 cm gallstone in the gallbladder, image 37 series 2. No gallbladder wall thickening or pericholecystic fluid. Hepatic parenchyma unremarkable. No biliary dilatation. Pancreas: Unremarkable Spleen: Unremarkable Adrenals/Urinary Tract: Prominent median lobe of prostate gland indents the bladder base. The kidneys and adrenal glands appeared unremarkable. Stomach/Bowel: Small bowel obstruction with dilated loops of small bowel extending to a sac-like collection of bowel loops in the right upper abdomen suspicious for internal hernia, transition point shown on images 51 through 57 of series 5 where the dilated loop approaches the sac of nondilated loops. Partial left colectomy and also evidence of prior small bowel surgery on the right. The dilated loops currently measure up to 4.6 cm. No findings of pneumatosis or extraluminal gas. No definite hypoenhancing bowel segments. There is also an air-level in the rectum which may indicate diarrheal process. Vascular/Lymphatic: Atherosclerosis is present, including aortoiliac atherosclerotic disease. Reproductive: Prostatomegaly indenting the bladder base. Other: Trace ascites in the vicinity of the left inguinal ring, image 72 series 2. Musculoskeletal: Levoconvex lumbar scoliosis.  Irregular bony destructive type findings along the L1-2 intervertebral disc space which is substantially widened with some flattening and reduced height especially of L1 and to a lesser degree of L2, appearance concerning for possible discitis-osteomyelitis. Degenerative findings at the remaining lumbar levels. Suspected right foraminal impingement at L1-2, L2-3, L3-4, L5-S1; and left foraminal impingement at L4-5 at L5-S1. IMPRESSION: 1. High-grade small bowel obstruction with transition point in the right upper abdomen where there is a sac-like collection of bowel loops suspicious for internal hernia. No pneumatosis or extraluminal gas. 2. Abnormal bony destructive findings along the L1-2 intervertebral disc space which is substantially widened with some flattening and reduced height of L1 and to a lesser degree of L2, appearance concerning for discitis-osteomyelitis. Lumbar MRI could be utilized for further characterization. 3. Air-level in the rectum which may indicate diarrheal process. 4. Cholelithiasis. 5. Prostatomegaly indenting the bladder base. 6. Multilevel impingement in the lumbar spine. 7. Borderline cardiomegaly with right coronary artery atherosclerotic calcification. 8. Trace  ascites near the left inguinal ring. 9. Aortic atherosclerosis. 10. Bandlike atelectasis or scarring in both lower lobes. 11. Partial left colectomy and also evidence of prior small bowel surgery on the right. Aortic Atherosclerosis (ICD10-I70.0). Electronically Signed   By: Van Clines M.D.   On: 07/08/2022 14:03    Procedures Procedures    Medications Ordered in ED Medications  diatrizoate meglumine-sodium (GASTROGRAFIN) 66-10 % solution 90 mL (has no administration in time range)  phytonadione (VITAMIN K) 10 mg in dextrose 5 % 50 mL IVPB (has no administration in time range)  HYDROmorphone (DILAUDID) injection 0.5 mg (0.5 mg Intravenous Given 07/08/22 1223)  lactated ringers bolus 1,000 mL (0 mLs  Intravenous Stopped 07/08/22 1345)  iohexol (OMNIPAQUE) 300 MG/ML solution 100 mL (100 mLs Intravenous Contrast Given 07/08/22 1337)  sodium chloride (PF) 0.9 % injection (  Given by Other 07/08/22 1344)  LORazepam (ATIVAN) injection 1 mg (1 mg Intravenous Given 07/08/22 1557)    ED Course/ Medical Decision Making/ A&P                           Medical Decision Making Amount and/or Complexity of Data Reviewed Labs: ordered.    Details: Leukocytosis as well as mild anion gap acidosis with normal lactate Radiology: ordered and independent interpretation performed.    Details: CT shows small bowel obstruction  Risk Prescription drug management. Decision regarding hospitalization.   Outside records reviewed.  Currently being treated for osteomyelitis with PICC line.  However today he is found to have small bowel obstruction.  No vomiting here and his pain is controlled with IV Dilaudid.  He was given IV fluids.  Lactate is okay.  Radiology is concern for internal hernia and I have discussed with general surgery who has seen patient and feels this is less likely and would like to treat nonoperatively for now.  They are requesting hospitalist admission and I have discussed with Dr. Olevia Bowens who will admit.  Surgery is requesting an NG tube.  Otherwise he appears stable.        Final Clinical Impression(s) / ED Diagnoses Final diagnoses:  Small bowel obstruction [facility])    Rx / DC Orders ED Discharge Orders     None         Sherwood Gambler, MD 07/08/22 1622

## 2022-07-08 NOTE — ED Triage Notes (Signed)
Pt BIBA from [facility] for abdominal pain and vomiting x3 days. Sharp pain across center of abdomen, comes in waves. Gallstones and fluid found on scans this morning, wanted to get further assessment. Currently receiving saline through J tube (?)  142/70 Hr 110 97% RA CBG 155

## 2022-07-08 NOTE — ED Notes (Signed)
Patient transported to CT 

## 2022-07-08 NOTE — Consult Note (Signed)
Mitchell Bright 19-Apr-1951  [id number].    Requesting MD: Dr. 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  This surgery was done in the [facility] system at [facility] and he has been to [facility] for rehab twice and now resides at [facility] for further rehab prior to going home.  He also has a history of a LGI bleed that required a left and sigmoid colectomy with anastomosis and a SBR for a small carcinoid tumor at the same time in 2005 by Dr. Marlou Starks.  He has had a SBO once before but has been many years ago.    The last 3 days he has been having significant abdominal pain.  It was thought originally this was secondary to constipated so he has had an enema and a suppository.  He did have some bowel movements but this did not help his pain.  He started having vomiting today.  The facility brought him to [facility] for further evaluation.  His CT scan revealed a high-grade SBO with a question of possible internal hernia, but his lactic acid is normal, WBC is 13K and other labs are relatively normal.  We have been asked to see him for further evaluation.   ROS: ROS: Please see HPI  History reviewed. No pertinent family history.  Past Medical History:  Diagnosis Date   Atrial fibrillation [facility])    chronic   Carcinoid tumor    Carotid artery dissection (HCC)    History of previous spontaneous carotid artery dissection   Diabetes mellitus    Hyperlipidemia    S/P small bowel resection    History of a partial bowel resection for a carcinoid   Seizure disorder [facility])     Past Surgical History:  Procedure Laterality Date   PARTIAL COLECTOMY     left, sigmoid colectomy by Dr. Marlou Starks in 2005   [place] History:  reports that  he has never smoked. He has never used smokeless tobacco. He reports current alcohol use. No history on file for drug use.  Allergies:  Allergies  Allergen Reactions   Oseltamivir Phosphate     (Not in a hospital admission)    Physical Exam: Blood pressure (!) 136/90, pulse (!) 104, temperature 97.7 F (36.5 C), resp. rate 17, SpO2 96 %. General: pleasant, WD, WN white male who is laying in bed in NAD HEENT: head is normocephalic, atraumatic.  Sclera are noninjected.  PERRL.  Ears and nose without any masses or lesions.  Mouth is pink and moist Heart: irregularly irregular.  Normal s1,s2. No obvious murmurs, gallops, or rubs noted.  Palpable radial and pedal pulses bilaterally Lungs: CTAB, no wheezes, rhonchi, or rales noted.  Respiratory effort nonlabored Abd: soft, diffuse tenderness, but no peritonitis or rebounding, some distention, +BS, no masses, hernias, or organomegaly.  Prior midline scar from previous laparotomy MS: all 4 extremities are symmetrical with no cyanosis, clubbing, or edema. Skin: warm and dry with no masses, lesions, or rashes Psych: A&Ox3 with an appropriate affect.   Results for orders placed or performed during the hospital encounter of 07/08/22 (from the past 48 hour(s))  CBC with Differential     Status: Abnormal  Collection Time: 07/08/22 12:07 PM  Result Value Ref Range   WBC 13.8 (H) 4.0 - 10.5 K/uL   RBC 5.22 4.22 - 5.81 MIL/uL   Hemoglobin 15.0 13.0 - 17.0 g/dL   HCT 47.3 39.0 - 52.0 %   MCV 90.6 80.0 - 100.0 fL   MCH 28.7 26.0 - 34.0 pg   MCHC 31.7 30.0 - 36.0 g/dL   RDW 14.4 11.5 - 15.5 %   Platelets 364 150 - 400 K/uL   nRBC 0.0 0.0 - 0.2 %   Neutrophils Relative % 79 %   Neutro Abs 11.0 (H) 1.7 - 7.7 K/uL   Lymphocytes Relative 14 %   Lymphs Abs 1.9 0.7 - 4.0 K/uL   Monocytes Relative 6 %   Monocytes Absolute 0.8 0.1 - 1.0 K/uL   Eosinophils Relative 1 %   Eosinophils Absolute 0.1 0.0 - 0.5 K/uL   Basophils Relative 0 %   Basophils  Absolute 0.1 0.0 - 0.1 K/uL   Immature Granulocytes 0 %   Abs Immature Granulocytes 0.05 0.00 - 0.07 K/uL    Comment: Performed at [facility], [place] [address] [place], [place] [address]  Protime-INR     Status: Abnormal   Collection Time: 07/08/22 12:07 PM  Result Value Ref Range   Prothrombin Time 35.0 (H) 11.4 - 15.2 seconds   INR 3.5 (H) 0.8 - 1.2    Comment: (NOTE) INR goal varies based on device and disease states. Performed at [facility], [place] [address], [place], [place] [address]   Comprehensive metabolic panel     Status: Abnormal   Collection Time: 07/08/22 12:35 PM  Result Value Ref Range   Sodium 137 135 - 145 mmol/L   Potassium 4.2 3.5 - 5.1 mmol/L   Chloride 99 98 - 111 mmol/L   CO2 20 (L) 22 - 32 mmol/L   Glucose, Bld 133 (H) 70 - 99 mg/dL    Comment: Glucose reference range applies only to samples taken after fasting for at least 8 hours.   BUN 31 (H) 8 - 23 mg/dL   Creatinine, Ser 0.84 0.61 - 1.24 mg/dL   Calcium 8.9 8.9 - 10.3 mg/dL   Total Protein 6.9 6.5 - 8.1 g/dL   Albumin 3.3 (L) 3.5 - 5.0 g/dL   AST 19 15 - 41 U/L   ALT 17 0 - 44 U/L   Alkaline Phosphatase 74 38 - 126 U/L   Total Bilirubin 1.5 (H) 0.3 - 1.2 mg/dL   GFR, Estimated >60 >60 mL/min    Comment: (NOTE) Calculated using the CKD-EPI Creatinine Equation (2021)    Anion gap 18 (H) 5 - 15    Comment: Performed at [facility], [place] [address], [place], [place] [address]  Lipase, blood     Status: None   Collection Time: 07/08/22 12:35 PM  Result Value Ref Range   Lipase 24 11 - 51 U/L    Comment: Performed at [facility], [place] [address], [place], [place] [address]  Lactic acid, plasma     Status: None   Collection Time: 07/08/22  2:07 PM  Result Value Ref Range   Lactic Acid, Venous 1.7 0.5 - 1.9 mmol/L    Comment: Performed at [facility], [place] [address] [place], [place] [address]   CT  ABDOMEN PELVIS W CONTRAST  Result Date: 07/08/2022 CLINICAL DATA:  Mid abdominal pain for about 1 month. Reportedly gallstones were found on  an outside ultrasound this morning. EXAM: CT ABDOMEN AND PELVIS WITH CONTRAST TECHNIQUE: Multidetector CT imaging of the abdomen and pelvis was performed using the standard protocol following bolus administration of intravenous contrast. RADIATION DOSE REDUCTION: This exam was performed according to the departmental dose-optimization program which includes automated exposure control, adjustment of the mA and/or kV according to patient size and/or use of iterative reconstruction technique. CONTRAST:  112m OMNIPAQUE IOHEXOL 300 MG/ML  SOLN COMPARISON:  CT scan 05/20/2006 FINDINGS: Lower chest: Bandlike atelectasis or scarring in both lower lobes. Borderline cardiomegaly. Right coronary artery atherosclerotic calcification. Hepatobiliary: 1.4 cm gallstone in the gallbladder, image 37 series 2. No gallbladder wall thickening or pericholecystic fluid. Hepatic parenchyma unremarkable. No biliary dilatation. Pancreas: Unremarkable Spleen: Unremarkable Adrenals/Urinary Tract: Prominent median lobe of prostate gland indents the bladder base. The kidneys and adrenal glands appeared unremarkable. Stomach/Bowel: Small bowel obstruction with dilated loops of small bowel extending to a sac-like collection of bowel loops in the right upper abdomen suspicious for internal hernia, transition point shown on images 51 through 57 of series 5 where the dilated loop approaches the sac of nondilated loops. Partial left colectomy and also evidence of prior small bowel surgery on the right. The dilated loops currently measure up to 4.6 cm. No findings of pneumatosis or extraluminal gas. No definite hypoenhancing bowel segments. There is also an air-level in the rectum which may indicate diarrheal process. Vascular/Lymphatic: Atherosclerosis is present, including aortoiliac atherosclerotic disease.  Reproductive: Prostatomegaly indenting the bladder base. Other: Trace ascites in the vicinity of the left inguinal ring, image 72 series 2. Musculoskeletal: Levoconvex lumbar scoliosis. Irregular bony destructive type findings along the L1-2 intervertebral disc space which is substantially widened with some flattening and reduced height especially of L1 and to a lesser degree of L2, appearance concerning for possible discitis-osteomyelitis. Degenerative findings at the remaining lumbar levels. Suspected right foraminal impingement at L1-2, L2-3, L3-4, L5-S1; and left foraminal impingement at L4-5 at L5-S1. IMPRESSION: 1. High-grade small bowel obstruction with transition point in the right upper abdomen where there is a sac-like collection of bowel loops suspicious for internal hernia. No pneumatosis or extraluminal gas. 2. Abnormal bony destructive findings along the L1-2 intervertebral disc space which is substantially widened with some flattening and reduced height of L1 and to a lesser degree of L2, appearance concerning for discitis-osteomyelitis. Lumbar MRI could be utilized for further characterization. 3. Air-level in the rectum which may indicate diarrheal process. 4. Cholelithiasis. 5. Prostatomegaly indenting the bladder base. 6. Multilevel impingement in the lumbar spine. 7. Borderline cardiomegaly with right coronary artery atherosclerotic calcification. 8. Trace ascites near the left inguinal ring. 9. Aortic atherosclerosis. 10. Bandlike atelectasis or scarring in both lower lobes. 11. Partial left colectomy and also evidence of prior small bowel surgery on the right. Aortic Atherosclerosis (ICD10-I70.0). Electronically Signed   By: WVan ClinesM.D.   On: 07/08/2022 14:03      Assessment/Plan SBO The patient has been seen, examined, chart, labs, vitals, and imaging have been personally reviewed.  He does appear to have an SBO and it is possible there may be a component of internal hernia  to this but difficult to tell.  He currently does not have an acute abdomen, vitals are stable, and his lactic acid is normal.  I think it is reasonable at this time to watchfully wait with an NGT for decompression and the SBO protocol.  If he does not resolve relatively quickly, he will likely require operative intervention.  With  that said, he is on coumadin with an INR of 3.5.  he does not need to be urgently reversed with FFP etc, but we will go ahead and start the slow process with '10mg'$  of Vit K and recommend holding his coumadin .  This all has been thoroughly discussed with the patient and he agrees with the plan.  He has asked that I call his wife and I will attempt to call her shortly.     FEN - NPO/NGT/IVFs VTE - hold coumadin, Vit. K '10mg'$  for now.  When gets below 2 could start heparin gtt from our standpoint if needed ID - none currently warranted for his SBO Admit - to medical service  Possible urinary retention - unable to void while I was in the room.   Has had issues with this secondary to BPH as well as recent UTIs and back issues Osteomyelitis of spine - currently on abx therapy DM HTN A fib - hold coumadin  I reviewed ED provider notes, last 24 h vitals and pain scores, last 48 h intake and output, last 24 h labs and trends, and last 24 h imaging results.  Henreitta Cea, [facility] 07/08/2022, 3:41 PM Please see Amion for pager number during day hours 7:00am-4:30pm or 7:00am -11:30am on weekends

## 2022-07-08 NOTE — Progress Notes (Signed)
Pharmacy Antibiotic Note  Mitchell Bright is a 71 y.o. male with hx laminectomy who subsequently had staph epidermidis bacteremia and osteomyelitis of the spine and being treated with daptomycin PTA.  Note from provider at [facility] indicated that plan is to treat with daptomycin thru 07/27/22.  He presented to the ED on 07/08/2022 with c/o vomiting and abdominal pain.  Pharmacy has been consulted to continue his PTA daptomycin regimen.  I called and spoke to [facility] staff and they informed me that he was on daptomycin 500 mg IV q24h with last dose given to him on 07/08/22 at 0800  Plan: - daptomycin 500 mg IV 24h - ck weekly _________________________________________  Height: '5\' 10"'$  (177.8 cm) Weight: 71.9 kg (158 lb 8.2 oz) IBW/kg (Calculated) : 73  Temp (24hrs), Avg:97.8 F (36.6 C), Min:97.7 F (36.5 C), Max:98 F (36.7 C)  Recent Labs  Lab 07/08/22 1207 07/08/22 1235 07/08/22 1407  WBC 13.8*  --   --   CREATININE  --  0.84  --   LATICACIDVEN  --   --  1.7    Estimated Creatinine Clearance: 82 mL/min (by C-G formula based on SCr of 0.84 mg/dL).    Allergies  Allergen Reactions   Oseltamivir Phosphate      Thank you for allowing pharmacy to be a part of this patient's care.  Lynelle Doctor 07/08/2022 7:37 PM

## 2022-07-09 ENCOUNTER — Inpatient Hospital Stay (HOSPITAL_COMMUNITY): Payer: Medicare Other

## 2022-07-09 DIAGNOSIS — Z8673 Personal history of transient ischemic attack (TIA), and cerebral infarction without residual deficits: Secondary | ICD-10-CM

## 2022-07-09 DIAGNOSIS — K56609 Unspecified intestinal obstruction, unspecified as to partial versus complete obstruction: Secondary | ICD-10-CM | POA: Diagnosis not present

## 2022-07-09 LAB — COMPREHENSIVE METABOLIC PANEL
ALT: 15 U/L (ref 0–44)
AST: 17 U/L (ref 15–41)
Albumin: 3 g/dL — ABNORMAL LOW (ref 3.5–5.0)
Alkaline Phosphatase: 65 U/L (ref 38–126)
Anion gap: 13 (ref 5–15)
BUN: 26 mg/dL — ABNORMAL HIGH (ref 8–23)
CO2: 24 mmol/L (ref 22–32)
Calcium: 8.2 mg/dL — ABNORMAL LOW (ref 8.9–10.3)
Chloride: 99 mmol/L (ref 98–111)
Creatinine, Ser: 0.58 mg/dL — ABNORMAL LOW (ref 0.61–1.24)
GFR, Estimated: >60 mL/min (ref >60)
Glucose, Bld: 95 mg/dL (ref 70–99)
Potassium: 3.5 mmol/L (ref 3.5–5.1)
Sodium: 136 mmol/L (ref 135–145)
Total Bilirubin: 1.4 mg/dL — ABNORMAL HIGH (ref 0.3–1.2)
Total Protein: 6 g/dL — ABNORMAL LOW (ref 6.5–8.1)

## 2022-07-09 LAB — GLUCOSE, CAPILLARY
Glucose-Capillary: 105 mg/dL — ABNORMAL HIGH (ref 70–99)
Glucose-Capillary: 123 mg/dL — ABNORMAL HIGH (ref 70–99)
Glucose-Capillary: 137 mg/dL — ABNORMAL HIGH (ref 70–99)
Glucose-Capillary: 90 mg/dL (ref 70–99)

## 2022-07-09 LAB — CK: Total CK: 41 U/L — ABNORMAL LOW (ref 49–397)

## 2022-07-09 LAB — CBC
HCT: 42.2 % (ref 39.0–52.0)
Hemoglobin: 13.2 g/dL (ref 13.0–17.0)
MCH: 28.3 pg (ref 26.0–34.0)
MCHC: 31.3 g/dL (ref 30.0–36.0)
MCV: 90.6 fL (ref 80.0–100.0)
Platelets: 305 10*3/uL (ref 150–400)
RBC: 4.66 MIL/uL (ref 4.22–5.81)
RDW: 14.4 % (ref 11.5–15.5)
WBC: 10.9 10*3/uL — ABNORMAL HIGH (ref 4.0–10.5)
nRBC: 0 % (ref 0.0–0.2)

## 2022-07-09 LAB — PROTIME-INR
INR: 1.5 — ABNORMAL HIGH (ref 0.8–1.2)
Prothrombin Time: 18.4 s — ABNORMAL HIGH (ref 11.4–15.2)

## 2022-07-09 MED ORDER — WARFARIN SODIUM 2.5 MG PO TABS
10.0000 mg | ORAL_TABLET | Freq: Once | ORAL | Status: AC
  Administered 2022-07-09: 10 mg via ORAL
  Filled 2022-07-09: qty 4

## 2022-07-09 MED ORDER — INSULIN GLARGINE-YFGN 100 UNIT/ML ~~LOC~~ SOLN
5.0000 [IU] | Freq: Every day | SUBCUTANEOUS | Status: DC
  Administered 2022-07-09 – 2022-07-12 (×4): 5 [IU] via SUBCUTANEOUS
  Filled 2022-07-09 (×5): qty 0.05

## 2022-07-09 MED ORDER — DILTIAZEM HCL ER 180 MG PO CP24: 180.0000 mg | ORAL_CAPSULE | Freq: Every day | ORAL | Status: DC

## 2022-07-09 MED ORDER — DILTIAZEM HCL ER COATED BEADS 180 MG PO CP24
180.0000 mg | ORAL_CAPSULE | Freq: Every day | ORAL | Status: DC
  Administered 2022-07-09 – 2022-07-12 (×4): 180 mg via ORAL
  Filled 2022-07-09 (×4): qty 1

## 2022-07-09 MED ORDER — ENOXAPARIN SODIUM 80 MG/0.8ML IJ SOSY
70.0000 mg | PREFILLED_SYRINGE | Freq: Two times a day (BID) | INTRAMUSCULAR | Status: DC
  Administered 2022-07-09 – 2022-07-10 (×4): 70 mg via SUBCUTANEOUS
  Filled 2022-07-09 (×4): qty 0.8

## 2022-07-09 MED ORDER — PREGABALIN 100 MG PO CAPS
100.0000 mg | ORAL_CAPSULE | Freq: Every day | ORAL | Status: DC
  Administered 2022-07-09 – 2022-07-12 (×4): 100 mg via ORAL
  Filled 2022-07-09 (×4): qty 1

## 2022-07-09 MED ORDER — BOOST / RESOURCE BREEZE PO LIQD CUSTOM
1.0000 | Freq: Three times a day (TID) | ORAL | Status: DC
  Administered 2022-07-09 – 2022-07-12 (×6): 1 via ORAL

## 2022-07-09 MED ORDER — TRAMADOL HCL 50 MG PO TABS: 50.0000 mg | ORAL_TABLET | Freq: Four times a day (QID) | ORAL | Status: DC | PRN

## 2022-07-09 MED ORDER — FENOFIBRATE 160 MG PO TABS
160.0000 mg | ORAL_TABLET | Freq: Every day | ORAL | Status: DC
  Administered 2022-07-09 – 2022-07-12 (×4): 160 mg via ORAL
  Filled 2022-07-09 (×4): qty 1

## 2022-07-09 MED ORDER — SODIUM CHLORIDE 0.9% FLUSH
10.0000 mL | Freq: Two times a day (BID) | INTRAVENOUS | Status: DC
  Administered 2022-07-09: 10 mL

## 2022-07-09 MED ORDER — DULOXETINE HCL 30 MG PO CPEP
30.0000 mg | ORAL_CAPSULE | Freq: Every day | ORAL | Status: DC
  Administered 2022-07-09 – 2022-07-12 (×4): 30 mg via ORAL
  Filled 2022-07-09 (×4): qty 1

## 2022-07-09 MED ORDER — SODIUM CHLORIDE 0.9% FLUSH
10.0000 mL | INTRAVENOUS | Status: DC | PRN
  Administered 2022-07-13: 10 mL
  Administered 2022-07-13: 20 mL

## 2022-07-09 MED ORDER — MELATONIN 3 MG PO TABS
3.0000 mg | ORAL_TABLET | Freq: Every day | ORAL | Status: DC
  Administered 2022-07-09 – 2022-07-12 (×4): 3 mg via ORAL
  Filled 2022-07-09 (×4): qty 1

## 2022-07-09 MED ORDER — DIGOXIN 125 MCG PO TABS
0.1250 mg | ORAL_TABLET | Freq: Every day | ORAL | Status: DC
  Administered 2022-07-09 – 2022-07-12 (×4): 0.125 mg via ORAL
  Filled 2022-07-09 (×5): qty 1

## 2022-07-09 MED ORDER — WARFARIN - PHARMACIST DOSING INPATIENT: Freq: Every day | Status: DC

## 2022-07-09 MED ORDER — METHOCARBAMOL 500 MG PO TABS
750.0000 mg | ORAL_TABLET | Freq: Four times a day (QID) | ORAL | Status: DC
  Administered 2022-07-09 – 2022-07-12 (×13): 750 mg via ORAL
  Filled 2022-07-09 (×13): qty 2

## 2022-07-09 MED ORDER — SODIUM CHLORIDE 0.9 % IV SOLN
1000.0000 mg | Freq: Once | INTRAVENOUS | Status: AC
  Administered 2022-07-09: 1000 mg via INTRAVENOUS
  Filled 2022-07-09: qty 20

## 2022-07-09 MED ORDER — TAMSULOSIN HCL 0.4 MG PO CAPS
0.4000 mg | ORAL_CAPSULE | Freq: Every day | ORAL | Status: DC
  Administered 2022-07-09 – 2022-07-12 (×4): 0.4 mg via ORAL
  Filled 2022-07-09 (×4): qty 1

## 2022-07-09 MED ORDER — LAMOTRIGINE 100 MG PO TABS
100.0000 mg | ORAL_TABLET | Freq: Every day | ORAL | Status: DC
  Administered 2022-07-09 – 2022-07-12 (×4): 100 mg via ORAL
  Filled 2022-07-09 (×4): qty 1

## 2022-07-09 MED ORDER — CHLORHEXIDINE GLUCONATE CLOTH 2 % EX PADS
6.0000 | MEDICATED_PAD | Freq: Every day | CUTANEOUS | Status: DC
  Administered 2022-07-09 – 2022-07-12 (×3): 6 via TOPICAL

## 2022-07-09 MED ORDER — PHENYTOIN SODIUM 50 MG/ML IJ SOLN
100.0000 mg | Freq: Three times a day (TID) | INTRAMUSCULAR | Status: DC
  Filled 2022-07-09: qty 2

## 2022-07-09 NOTE — Hospital Course (Addendum)
  71 year old male with chronic A-fib history of carotid artery dissection type 2 diabetes hyperlipidemia seizure disorder TIA GI bleed carcinoid tumor with history of small bowel resection/sigmoidectomy recent laminectomy UTI Staph epidermidis bacteremia and osteomyelitis of the spine currently on daptomycin, admitted with working diagnosis of small bowel obstruction.  Patient presented after having abdominal pain and emesis for last 4 days, last appointment 11/1, no fever chills chest pain.  In the ED vitals fairly stable with tachycardia, labs with leukocytosis, metabolic acidosis bicarb 20, total bilirubin 1.5 normal lactic acid INR 3.5. CT abdomen pelvis with contrast: High-grade SBO transition point right upper abdomen with 3 cystic-like collection of bowel loops suspicious for internal hernia, bony destructive finding L1-2 intervertebral disc space substantially widened with some flattening concerning for discitis/osteomyelitis cholelithiasis elevated rectum indicating diarrheal process, prostatomegaly cardiomegaly aortic atherosclerosis scaring of lower lobes partial left colectomy. General surgery consulted and was admitted Managed with IV fluids NG tube decompression and resolved.  Now on oral diet At this time medically stable for discharge, awaiting placement, but is ambulating

## 2022-07-09 NOTE — Progress Notes (Signed)
Mitchell Bright for Warfarin, Lovenox Indication: atrial fibrillation  Allergies  Allergen Reactions   Oseltamivir Phosphate     Patient Measurements: Height: '5\' 10"'$  (177.8 cm) Weight: 71.9 kg (158 lb 8.2 oz) IBW/kg (Calculated) : 73 Heparin Dosing Weight:   Vital Signs: Temp: 97.6 F (36.4 C) (11/03 0539) Temp Source: Oral (11/03 0539) BP: 120/83 (11/03 0539) Pulse Rate: 85 (11/03 0539)  Labs: Recent Labs    07/08/22 1207 07/08/22 1235 07/09/22 0415  HGB 15.0  --  13.2  HCT 47.3  --  42.2  PLT 364  --  305  LABPROT 35.0*  --  18.4*  INR 3.5*  --  1.5*  CREATININE  --  0.84 0.58*  CKTOTAL  --   --  41*    Estimated Creatinine Clearance: 86.1 mL/min (A) (by C-G formula based on SCr of 0.58 mg/dL (L)).   Medications:  Scheduled:   Chlorhexidine Gluconate Cloth  6 each Topical Daily   pantoprazole (PROTONIX) IV  40 mg Intravenous Q24H   phenytoin (DILANTIN) IV  100 mg Intravenous Q8H   sodium chloride flush  10-40 mL Intracatheter Q12H   Infusions:   DAPTOmycin (CUBICIN) 500 mg in sodium chloride 0.9 % IVPB     lactated ringers 125 mL/hr at 07/09/22 0148    Assessment: 11 yoM presented to ED on 11/2 with SBO.  PMH is significant for Afib on chronic warfarin which was held on admission for possible surgical intervention.  GI function has now improved and pharmacy is consulted to resume Warfarin dosing and bridge with Lovenox.    PTA warfarin 7.5 mg daily.  LD on 11/1.  Admit INR 3.5 - Reversal:  Vit K '10mg'$  IV on 11/2  Today, 07/09/2022: INR 1.5 CBC:  Hgb and Plt WNL No bleeding or complications reported Diet: advanced to CLD.  Pt reports having 2 BMs.  No surgical plans at this time. Drug-drug interactions: no new interactions, but expect that VitK given on 11/2 may suppress INR for several days.    Goal of Therapy:  INR 2-3 Monitor platelets by anticoagulation protocol: Yes   Plan:  Enoxaparin 1 mg/kg ('70mg'$ ) SQ  q12h Warfarin 10 mg PO x 1  Daily PT/INR. Monitor for signs and symptoms of bleeding.   Gretta Arab PharmD, BCPS [facility] main pharmacy [phone] 07/09/2022 9:47 AM

## 2022-07-09 NOTE — Progress Notes (Signed)
Initial Nutrition Assessment  INTERVENTION:   -Boost Breeze po TID, each supplement provides 250 kcal and 9 grams of protein   NUTRITION DIAGNOSIS:   Inadequate oral intake related to nausea, vomiting (SBO) as evidenced by per patient/family report.  GOAL:   Patient will meet greater than or equal to 90% of their needs  MONITOR:   PO intake, Supplement acceptance, Labs, I & O's, Weight trends  REASON FOR ASSESSMENT:   Malnutrition Screening Tool    ASSESSMENT:   71 year old male with chronic A-fib history of carotid artery dissection type 2 diabetes hyperlipidemia seizure disorder TIA GI bleed carcinoid tumor with history of small bowel resection/sigmoidectomy recent laminectomy UTI Staph epidermidis bacteremia and osteomyelitis of the spine currently on daptomycin, admitted with working diagnosis of small bowel obstruction.  Patient in room, states he has had a BM in his bed when trying to urinate. States this has been happening today. Nurse tech on her way to help clean him up. Pt states clear liquids are going well today. He drank everything but the broth. Pt is agreeable to receiving protein supplements given his poor PO over the past week. States he was not eating much d/t vomiting for 3 days PTA.  Per patient, he has lost 28 lbs since May 2023.  Per weight records, pt has lost 26 lbs since 7/20 (14% wt loss x 3.5 months, significant for time frame).   Medications: Lactated ringers  Labs reviewed: CBGs: 90-137   NUTRITION - FOCUSED PHYSICAL EXAM:  Flowsheet Row Most Recent Value  Orbital Region No depletion  Upper Arm Region Mild depletion  Thoracic and Lumbar Region No depletion  Buccal Region No depletion  Temple Region No depletion  Clavicle Bone Region No depletion  Clavicle and Acromion Bone Region No depletion  Scapular Bone Region Unable to assess  Dorsal Hand Unable to assess  Patellar Region Mild depletion  Anterior Thigh Region Mild depletion   Posterior Calf Region Mild depletion  Edema (RD Assessment) None  Hair Reviewed  Eyes Reviewed  Mouth Reviewed  Skin Reviewed  [dry, flaky]       Diet Order:   Diet Order             Diet clear liquid Room service appropriate? Yes; Fluid consistency: Thin  Diet effective now                   EDUCATION NEEDS:   No education needs have been identified at this time  Skin:  Skin Assessment: Reviewed RN Assessment  Last BM:  11/3 -type 6  Height:   Ht Readings from Last 1 Encounters:  07/08/22 '5\' 10"'$  (1.778 m)    Weight:   Wt Readings from Last 1 Encounters:  07/08/22 71.9 kg    BMI:  Body mass index is 22.74 kg/m.  Estimated Nutritional Needs:   Kcal:  1800-2000  Protein:  85-95g  Fluid:  2L/day  Clayton Bibles, MS, RD, LDN Inpatient Clinical Dietitian Contact information available via Amion

## 2022-07-09 NOTE — TOC Initial Note (Signed)
Transition of Care [facility]) - Initial/Assessment Note    Patient Details  Name: Mitchell Bright MRN: [id number] Date of Birth: [birth year]  [facility] CM/SW Contact:    Lennart Pall, LCSW Phone Number: 07/09/2022, 2:49 PM  Clinical Narrative:                 Have spoken with pt and wife regarding dc planning needs.  Pt is admitted from SNF bed at [facility], however, he does not wish to return to that facility.  He is requesting [facility] where he has been prior.  Have explained to both the CSW will contact [facility] to confirm if they could consider/ offer bed and would need to return to [place] if this cannot be worked out.  VM left with [place] admissions.  Expected Discharge Plan: Skilled Nursing Facility Barriers to Discharge: Continued Medical Work up, SNF Pending bed offer   Patient Goals and CMS Choice Patient states their goals for this hospitalization and ongoing recovery are:: hopes to dc to [facility] SNF if possible      Expected Discharge Plan and Services Expected Discharge Plan: [place] In-house Referral: Clinical Social Work   Post Acute Care Choice: [place] Living arrangements for the past 2 months: Single Family Home                 DME Arranged: N/A DME Agency: NA                  Prior Living Arrangements/Services Living arrangements for the past 2 months: Single Family Home Lives with:: Spouse Patient language and need for interpreter reviewed:: Yes Do you feel safe going back to the place where you live?: Yes      Need for Family Participation in Patient Care: No (Comment) Care giver support system in place?: No (comment)   Criminal Activity/Legal Involvement Pertinent to Current Situation/Hospitalization: No - Comment as needed  Activities of Daily Living Home Assistive Devices/Equipment: Hearing aid, Eyeglasses, CPAP, Cane (specify quad or straight), Shower chair without back,  Grab bars in shower (bilateral hearing aides,reading glasses. quad cane, railings in shower) ADL Screening (condition at time of admission) Patient's cognitive ability adequate to safely complete daily activities?: Yes Is the patient deaf or have difficulty hearing?: Yes (very hoh in left ear, wears bilateral hearing aides) Does the patient have difficulty seeing, even when wearing glasses/contacts?: No Does the patient have difficulty concentrating, remembering, or making decisions?: No Patient able to express need for assistance with ADLs?: Yes Does the patient have difficulty dressing or bathing?: No Independently performs ADLs?: Yes (appropriate for developmental age) Does the patient have difficulty walking or climbing stairs?: No Weakness of Legs: Both Weakness of Arms/Hands: Both  Permission Sought/Granted Permission sought to share information with : Family Supports, Chartered certified accountant granted to share information with : Yes, Verbal Permission Granted  Share Information with NAME: Mitchell Bright     Permission granted to share info w Relationship: spouse  Permission granted to share info w Contact Information: [phone]  Emotional Assessment Appearance:: Appears stated age Attitude/Demeanor/Rapport: Gracious Affect (typically observed): Accepting Orientation: : Oriented to Self, Oriented to Place, Oriented to  Time, Oriented to Situation Alcohol / Substance Use: Not Applicable Psych Involvement: No (comment)  Admission diagnosis:  Small bowel obstruction (HCC) [K56.609] SBO (small bowel obstruction) ([place]) [K56.609] Patient Active Problem List   Diagnosis Date Noted   History of TIA (transient ischemic attack) 07/09/2022  SBO (small bowel obstruction) ([place]) 07/08/2022   Type 2 diabetes mellitus ([place]) 07/08/2022   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[place]) 06/18/2022   Staphylococcus epidermidis bacteremia 06/15/2022   History of lumbar laminectomy for  spinal cord decompression 03/25/2022   Degenerative scoliosis 12/31/2021   Hearing loss, bilateral 08/25/2021   ETD (Eustachian tube dysfunction), bilateral 10/29/2019   Rhinitis, chronic 10/29/2019   Diabetic peripheral neuropathy ([place]) 10/22/2015   Nonintractable epilepsy with complex partial seizures ([place]) 10/22/2015   Personal history of other diseases of the digestive system 03/20/2015   Rotator cuff syndrome 04/23/2014   Personal history of transient ischemic attack (TIA), and cerebral infarction without residual deficits 12/12/2012   CHEST PAIN-PRECORDIAL 01/29/2010   HYPERCHOLESTEROLEMIA 08/08/2008   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

## 2022-07-09 NOTE — Progress Notes (Signed)
Subjective: Patient feels much better today.  Has had 2 bowel movements.  ROS: See above, otherwise other systems negative  Objective: Vital signs in last 24 hours: Temp:  [97.6 F (36.4 C)-98 F (36.7 C)] 97.6 F (36.4 C) (11/03 0539) Pulse Rate:  [85-121] 85 (11/03 0539) Resp:  [16-18] 17 (11/02 2157) BP: (120-158)/(82-109) 120/83 (11/03 0539) SpO2:  [96 %-100 %] 98 % (11/03 0539) Weight:  [71.9 kg] 71.9 kg (11/02 1804) Last BM Date : 07/08/22  Intake/Output from previous day: 11/02 0701 - 11/03 0700 In: 2581.8 [I.V.:1307; IV Piggyback:1274.8] Out: 550 [Urine:400; Emesis/NG output:150] Intake/Output this shift: No intake/output data recorded.  PE: Abd: much softer today and less pain, NGT with 150cc of output.  +BS, NT  Lab Results:  Recent Labs    07/08/22 1207 07/09/22 0415  WBC 13.8* 10.9*  HGB 15.0 13.2  HCT 47.3 42.2  PLT 364 305   BMET Recent Labs    07/08/22 1235 07/09/22 0415  NA 137 136  K 4.2 3.5  CL 99 99  CO2 20* 24  GLUCOSE 133* 95  BUN 31* 26*  CREATININE 0.84 0.58*  CALCIUM 8.9 8.2*   PT/INR Recent Labs    07/08/22 1207 07/09/22 0415  LABPROT 35.0* 18.4*  INR 3.5* 1.5*   CMP     Component Value Date/Time   NA 136 07/09/2022 0415   K 3.5 07/09/2022 0415   CL 99 07/09/2022 0415   CO2 24 07/09/2022 0415   GLUCOSE 95 07/09/2022 0415   BUN 26 (H) 07/09/2022 0415   CREATININE 0.58 (L) 07/09/2022 0415   CALCIUM 8.2 (L) 07/09/2022 0415   PROT 6.0 (L) 07/09/2022 0415   ALBUMIN 3.0 (L) 07/09/2022 0415   AST 17 07/09/2022 0415   ALT 15 07/09/2022 0415   ALKPHOS 65 07/09/2022 0415   BILITOT 1.4 (H) 07/09/2022 0415   GFRNONAA >60 07/09/2022 0415   Lipase     Component Value Date/Time   LIPASE 24 07/08/2022 1235       Studies/Results: DG Abd Portable 1V-Small Bowel Obstruction Protocol-initial, 8 hr delay  Result Date: 07/09/2022 CLINICAL DATA:  Small-bowel obstruction EXAM: PORTABLE ABDOMEN - 1 VIEW COMPARISON:   CT 07/08/2022 FINDINGS: Contrast opacifies numerous dilated loops of small bowel within the mid abdomen. However, oral contrast has now passed into the ascending colon and together the findings suggest the presence of a distal partial or resolving small bowel obstruction. Contrast opacifies the bladder lumen. No free intraperitoneal gas. IMPRESSION: 1. Findings suggestive of a distal partial or resolving small bowel obstruction with contrast now opacifying the ascending colon. Electronically Signed   By: Fidela Salisbury M.D.   On: 07/09/2022 02:38   DG Abd Portable 1V-Small Bowel Protocol-Position Verification  Result Date: 07/08/2022 CLINICAL DATA:  Enteric catheter placement EXAM: PORTABLE ABDOMEN - 1 VIEW COMPARISON:  07/08/2022 FINDINGS: Single frontal view of the chest demonstrates an unremarkable cardiac silhouette. Left-sided PICC tip overlies the superior vena cava. Enteric catheter passes below diaphragm tip and side port projecting over gastric fundus. No airspace disease, effusion, or pneumothorax. No acute bony abnormality. IMPRESSION: 1. Enteric catheter tip projecting over gastric fundus. 2. No acute intrathoracic process. Electronically Signed   By: Randa Ngo M.D.   On: 07/08/2022 17:17   CT ABDOMEN PELVIS W CONTRAST  Result Date: 07/08/2022 CLINICAL DATA:  Mid abdominal pain for about 1 month. Reportedly gallstones were found on an outside ultrasound this morning. EXAM: CT ABDOMEN AND  PELVIS WITH CONTRAST TECHNIQUE: Multidetector CT imaging of the abdomen and pelvis was performed using the standard protocol following bolus administration of intravenous contrast. RADIATION DOSE REDUCTION: This exam was performed according to the departmental dose-optimization program which includes automated exposure control, adjustment of the mA and/or kV according to patient size and/or use of iterative reconstruction technique. CONTRAST:  128m OMNIPAQUE IOHEXOL 300 MG/ML  SOLN COMPARISON:  CT scan  05/20/2006 FINDINGS: Lower chest: Bandlike atelectasis or scarring in both lower lobes. Borderline cardiomegaly. Right coronary artery atherosclerotic calcification. Hepatobiliary: 1.4 cm gallstone in the gallbladder, image 37 series 2. No gallbladder wall thickening or pericholecystic fluid. Hepatic parenchyma unremarkable. No biliary dilatation. Pancreas: Unremarkable Spleen: Unremarkable Adrenals/Urinary Tract: Prominent median lobe of prostate gland indents the bladder base. The kidneys and adrenal glands appeared unremarkable. Stomach/Bowel: Small bowel obstruction with dilated loops of small bowel extending to a sac-like collection of bowel loops in the right upper abdomen suspicious for internal hernia, transition point shown on images 51 through 57 of series 5 where the dilated loop approaches the sac of nondilated loops. Partial left colectomy and also evidence of prior small bowel surgery on the right. The dilated loops currently measure up to 4.6 cm. No findings of pneumatosis or extraluminal gas. No definite hypoenhancing bowel segments. There is also an air-level in the rectum which may indicate diarrheal process. Vascular/Lymphatic: Atherosclerosis is present, including aortoiliac atherosclerotic disease. Reproductive: Prostatomegaly indenting the bladder base. Other: Trace ascites in the vicinity of the left inguinal ring, image 72 series 2. Musculoskeletal: Levoconvex lumbar scoliosis. Irregular bony destructive type findings along the L1-2 intervertebral disc space which is substantially widened with some flattening and reduced height especially of L1 and to a lesser degree of L2, appearance concerning for possible discitis-osteomyelitis. Degenerative findings at the remaining lumbar levels. Suspected right foraminal impingement at L1-2, L2-3, L3-4, L5-S1; and left foraminal impingement at L4-5 at L5-S1. IMPRESSION: 1. High-grade small bowel obstruction with transition point in the right upper  abdomen where there is a sac-like collection of bowel loops suspicious for internal hernia. No pneumatosis or extraluminal gas. 2. Abnormal bony destructive findings along the L1-2 intervertebral disc space which is substantially widened with some flattening and reduced height of L1 and to a lesser degree of L2, appearance concerning for discitis-osteomyelitis. Lumbar MRI could be utilized for further characterization. 3. Air-level in the rectum which may indicate diarrheal process. 4. Cholelithiasis. 5. Prostatomegaly indenting the bladder base. 6. Multilevel impingement in the lumbar spine. 7. Borderline cardiomegaly with right coronary artery atherosclerotic calcification. 8. Trace ascites near the left inguinal ring. 9. Aortic atherosclerosis. 10. Bandlike atelectasis or scarring in both lower lobes. 11. Partial left colectomy and also evidence of prior small bowel surgery on the right. Aortic Atherosclerosis (ICD10-I70.0). Electronically Signed   By: WVan ClinesM.D.   On: 07/08/2022 14:03    Anti-infectives: Anti-infectives (From admission, onward)    Start     Dose/Rate Route Frequency Ordered Stop   07/09/22 0900  DAPTOmycin (CUBICIN) 500 mg in sodium chloride 0.9 % IVPB        500 mg 120 mL/hr over 30 Minutes Intravenous Every 24 hours 07/08/22 2245          Assessment/Plan SBO -protocol with contrast in the colon. -moving his bowels and passing flatus -Dc NGT and adv to CLD -may ADAT after this. -patient would like to return to a different rehab facility.  Will defer to primary teams.   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   LOS: 1  day    Henreitta Cea , [facility] 07/09/2022, 8:25 AM Please see Amion for pager number during day hours 7:00am-4:30pm or 7:00am -11:30am on weekends

## 2022-07-09 NOTE — Progress Notes (Signed)
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  Patient presented after having abdominal pain and emesis for last 4 days, last appointment 11/1, no fever chills chest pain.  In the ED vitals fairly stable with tachycardia, labs with leukocytosis, metabolic acidosis bicarb 20, total bilirubin 1.5 normal lactic acid INR 3.5. CT abdomen pelvis with contrast: High-grade SBO transition point right upper abdomen with 3 cystic-like collection of bowel loops suspicious for internal hernia, bony destructive finding L1-2 intervertebral disc space substantially widened with some flattening concerning for discitis/osteomyelitis cholelithiasis elevated rectum indicating diarrheal process, prostatomegaly cardiomegaly aortic atherosclerosis scaring of lower lobes partial left colectomy. General surgery consulted and was admitted       Subjective: Seen and examined had 2 bowel movements,feels much better no new abdominal pain.  Assessment and Plan:  High-grade small bowel obstruction history of small bowel obstruction, partial colectomy: Surgery following, continue plan as per surgery with n.p.o./NGT/IV fluids, Coumadin being held, got vitamin K. Although abnormal finding on CT some stool in colon.  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  CK stable, WBC downtrending.  Continue  tramadol, Lyrica, pain medication Cymbalta  Chronic A-fib on Coumadin, with supratherapeutic INR: Coumadin reversed due to SBO.  INR subtherapeutic> start Lovenox to bridge, pharmacy to resume Coumadin, trend INR  Seizure disorder: Resuming oral phenytoin/Lamictal Type 2 diabetes mellitus with peripheral neuropathy: cont ssi, resume Lantus 5 units.  At home on 25 units. Recent Labs  Lab 07/08/22 1829 07/09/22 0027 07/09/22 0424  GLUCAP 94 90 105*   .  Hyperlipidemia: Statin is on hold while on daptomycin resume fenofibrate   DVT prophylaxis: Lovenox/Coumadin Code Status:   Code Status: Full Code Family Communication: plan of care discussed with patient at bedside. Patient status is: Inpatient because of bowel obstruction Level of care: Telemetry   Dispo: The patient is from: SNF            Anticipated disposition: SNF-he wants to go to [facility] consulted  Mobility Assessment (last 72 hours)     Mobility Assessment     Row Name 07/08/22 2000 07/08/22 1800         Does patient have an order for bedrest or is patient medically unstable No - Continue assessment No - Continue assessment      What is the highest level of mobility based on the progressive mobility assessment? Level 2 (Chairfast) - Balance while sitting on edge of bed and cannot stand --                Objective: Vitals last 24 hrs: Vitals:   07/08/22 2157 07/09/22 0106 07/09/22 0539 07/09/22 1003  BP: (!) 157/91 (!) 150/87 120/83 (!) 130/91  Pulse: (!) 110 97 85 97  Resp: 17   18  Temp: 97.7 F (36.5 C) 97.7 F (36.5 C) 97.6 F (36.4 C) (!) 97.4 F (36.3 C)  TempSrc: Oral  Oral Oral  SpO2: 97% 97% 98% 99%  Weight:  Height:       Weight change:   Physical Examination: General exam: alert awake, older than stated age HEENT:Oral mucosa moist, Ear/Nose WNL grossly Respiratory system: bilaterally CLEAR BS, no use of accessory muscle Cardiovascular system: S1 & S2 +, No  JVD. Gastrointestinal system: Abdomen soft,NT,ND, BS+ Nervous System:Alert, awake, moving extremities. Extremities: LE edema NEG,distal peripheral pulses palpable.  Skin: No rashes,no icterus. MSK: Normal muscle bulk,tone, power  Medications reviewed:  Scheduled Meds:  Chlorhexidine Gluconate Cloth  6 each Topical Daily   enoxaparin (LOVENOX) injection  70 mg Subcutaneous Q12H   lamoTRIgine  100 mg Oral Daily   pantoprazole (PROTONIX) IV  40 mg Intravenous Q24H   sodium chloride flush  10-40 mL Intracatheter Q12H   warfarin  10 mg Oral ONCE-1600   Warfarin - Pharmacist Dosing Inpatient   Does not apply q1600   Continuous Infusions:  DAPTOmycin (CUBICIN) 500 mg in sodium chloride 0.9 % IVPB     lactated ringers 125 mL/hr at 07/09/22 0148      Diet Order             Diet clear liquid Room service appropriate? Yes; Fluid consistency: Thin  Diet effective now                  Intake/Output Summary (Last 24 hours) at 07/09/2022 1013 Last data filed at 07/09/2022 0600 Gross per 24 hour  Intake 2581.82 ml  Output 550 ml  Net 2031.82 ml   Net IO Since Admission: 2,031.82 mL [07/09/22 1013]  Wt Readings from Last 3 Encounters:  07/08/22 71.9 kg     Unresulted Labs (From admission, onward)     Start     Ordered   07/10/22 1950  Basic metabolic panel  Daily at 5am,   R     Question:  Specimen collection method  Answer:  IV Team=IV Team collect   07/09/22 0755   07/10/22 0500  CBC  Daily at 5am,   R     Question:  Specimen collection method  Answer:  IV Team=IV Team collect   07/09/22 0755   07/09/22 0500  Protime-INR  Daily at 5am,   R      07/08/22 1635   07/09/22 0500  CK  Every Friday,   R      07/08/22 2245          Data Reviewed: I have personally reviewed following labs and imaging studies CBC: Recent Labs  Lab 07/08/22 1207 07/09/22 0415  WBC 13.8* 10.9*  NEUTROABS 11.0*  --   HGB 15.0 13.2  HCT 47.3 42.2  MCV 90.6 90.6  PLT 364 932   Basic  Metabolic Panel: Recent Labs  Lab 07/08/22 1235 07/09/22 0415  NA 137 136  K 4.2 3.5  CL 99 99  CO2 20* 24  GLUCOSE 133* 95  BUN 31* 26*  CREATININE 0.84 0.58*  CALCIUM 8.9 8.2*  MG 2.0  --   GFR: Estimated Creatinine Clearance: 86.1 mL/min (A) (by C-G formula based on SCr of 0.58 mg/dL (L)). Liver Function Tests: Recent Labs  Lab 07/08/22 1235 07/09/22 0415  AST 19 17  ALT 17 15  ALKPHOS 74 65  BILITOT 1.5* 1.4*  PROT 6.9 6.0*  ALBUMIN 3.3* 3.0*   Recent Labs  Lab 07/08/22 1235  LIPASE 24  Coagulation Profile: Recent Labs  Lab 07/08/22 1207 07/09/22 0415  INR 3.5* 1.5*  CBG: Recent Labs  Lab 07/08/22 1829 07/09/22 0027 07/09/22 0424  GLUCAP  94 90 105*   Recent Labs  Lab 07/08/22 1407 07/08/22 2016  LATICACIDVEN 1.7 1.4    No results found for this or any previous visit (from the past 240 hour(s)).  Antimicrobials: Anti-infectives (From admission, onward)    Start     Dose/Rate Route Frequency Ordered Stop   07/09/22 0900  DAPTOmycin (CUBICIN) 500 mg in sodium chloride 0.9 % IVPB        500 mg 120 mL/hr over 30 Minutes Intravenous Every 24 hours 07/08/22 2245        Culture/Microbiology No results found for: "SDES", "SPECREQUEST", "CULT", "REPTSTATUS"  Other culture-see note  Radiology Studies: DG Abd Portable 1V-Small Bowel Obstruction Protocol-initial, 8 hr delay  Result Date: 07/09/2022 CLINICAL DATA:  Small-bowel obstruction EXAM: PORTABLE ABDOMEN - 1 VIEW COMPARISON:  CT 07/08/2022 FINDINGS: Contrast opacifies numerous dilated loops of small bowel within the mid abdomen. However, oral contrast has now passed into the ascending colon and together the findings suggest the presence of a distal partial or resolving small bowel obstruction. Contrast opacifies the bladder lumen. No free intraperitoneal gas. IMPRESSION: 1. Findings suggestive of a distal partial or resolving small bowel obstruction with contrast now opacifying the ascending colon.  Electronically Signed   By: Fidela Salisbury M.D.   On: 07/09/2022 02:38   DG Abd Portable 1V-Small Bowel Protocol-Position Verification  Result Date: 07/08/2022 CLINICAL DATA:  Enteric catheter placement EXAM: PORTABLE ABDOMEN - 1 VIEW COMPARISON:  07/08/2022 FINDINGS: Single frontal view of the chest demonstrates an unremarkable cardiac silhouette. Left-sided PICC tip overlies the superior vena cava. Enteric catheter passes below diaphragm tip and side port projecting over gastric fundus. No airspace disease, effusion, or pneumothorax. No acute bony abnormality. IMPRESSION: 1. Enteric catheter tip projecting over gastric fundus. 2. No acute intrathoracic process. Electronically Signed   By: Randa Ngo M.D.   On: 07/08/2022 17:17   CT ABDOMEN PELVIS W CONTRAST  Result Date: 07/08/2022 CLINICAL DATA:  Mid abdominal pain for about 1 month. Reportedly gallstones were found on an outside ultrasound this morning. EXAM: CT ABDOMEN AND PELVIS WITH CONTRAST TECHNIQUE: Multidetector CT imaging of the abdomen and pelvis was performed using the standard protocol following bolus administration of intravenous contrast. RADIATION DOSE REDUCTION: This exam was performed according to the departmental dose-optimization program which includes automated exposure control, adjustment of the mA and/or kV according to patient size and/or use of iterative reconstruction technique. CONTRAST:  149m OMNIPAQUE IOHEXOL 300 MG/ML  SOLN COMPARISON:  CT scan 05/20/2006 FINDINGS: Lower chest: Bandlike atelectasis or scarring in both lower lobes. Borderline cardiomegaly. Right coronary artery atherosclerotic calcification. Hepatobiliary: 1.4 cm gallstone in the gallbladder, image 37 series 2. No gallbladder wall thickening or pericholecystic fluid. Hepatic parenchyma unremarkable. No biliary dilatation. Pancreas: Unremarkable Spleen: Unremarkable Adrenals/Urinary Tract: Prominent median lobe of prostate gland indents the bladder base.  The kidneys and adrenal glands appeared unremarkable. Stomach/Bowel: Small bowel obstruction with dilated loops of small bowel extending to a sac-like collection of bowel loops in the right upper abdomen suspicious for internal hernia, transition point shown on images 51 through 57 of series 5 where the dilated loop approaches the sac of nondilated loops. Partial left colectomy and also evidence of prior small bowel surgery on the right. The dilated loops currently measure up to 4.6 cm. No findings of pneumatosis or extraluminal gas. No definite hypoenhancing bowel segments. There is also an air-level in the rectum which may indicate diarrheal process. Vascular/Lymphatic: Atherosclerosis is present, including aortoiliac atherosclerotic disease. Reproductive:  Prostatomegaly indenting the bladder base. Other: Trace ascites in the vicinity of the left inguinal ring, image 72 series 2. Musculoskeletal: Levoconvex lumbar scoliosis. Irregular bony destructive type findings along the L1-2 intervertebral disc space which is substantially widened with some flattening and reduced height especially of L1 and to a lesser degree of L2, appearance concerning for possible discitis-osteomyelitis. Degenerative findings at the remaining lumbar levels. Suspected right foraminal impingement at L1-2, L2-3, L3-4, L5-S1; and left foraminal impingement at L4-5 at L5-S1. IMPRESSION: 1. High-grade small bowel obstruction with transition point in the right upper abdomen where there is a sac-like collection of bowel loops suspicious for internal hernia. No pneumatosis or extraluminal gas. 2. Abnormal bony destructive findings along the L1-2 intervertebral disc space which is substantially widened with some flattening and reduced height of L1 and to a lesser degree of L2, appearance concerning for discitis-osteomyelitis. Lumbar MRI could be utilized for further characterization. 3. Air-level in the rectum which may indicate diarrheal process.  4. Cholelithiasis. 5. Prostatomegaly indenting the bladder base. 6. Multilevel impingement in the lumbar spine. 7. Borderline cardiomegaly with right coronary artery atherosclerotic calcification. 8. Trace ascites near the left inguinal ring. 9. Aortic atherosclerosis. 10. Bandlike atelectasis or scarring in both lower lobes. 11. Partial left colectomy and also evidence of prior small bowel surgery on the right. Aortic Atherosclerosis (ICD10-I70.0). Electronically Signed   By: Van Clines M.D.   On: 07/08/2022 14:03     LOS: 1 day   Antonieta Pert, MD [facility]  07/09/2022, 10:13 AM

## 2022-07-09 NOTE — Progress Notes (Signed)
MEDICATION RELATED CONSULT NOTE - INITIAL   Pharmacy Consult for Phenytoin Indication: seizure prophylaxis while NPO  Allergies  Allergen Reactions   Oseltamivir Phosphate     Patient Measurements: Height: '5\' 10"'$  (177.8 cm) Weight: 71.9 kg (158 lb 8.2 oz) IBW/kg (Calculated) : 73   Vital Signs: Temp: 97.7 F (36.5 C) (11/02 2157) Temp Source: Oral (11/02 2157) BP: 157/91 (11/02 2157) Pulse Rate: 110 (11/02 2157) Intake/Output from previous day: 11/02 0701 - 11/03 0700 In: 1002.1 [I.V.:2.1; IV Piggyback:1000] Out: 50 [Emesis/NG output:50] Intake/Output from this shift: No intake/output data recorded.  Labs: Recent Labs    07/08/22 1207 07/08/22 1235  WBC 13.8*  --   HGB 15.0  --   HCT 47.3  --   PLT 364  --   CREATININE  --  0.84  MG  --  2.0  ALBUMIN  --  3.3*  PROT  --  6.9  AST  --  19  ALT  --  17  ALKPHOS  --  74  BILITOT  --  1.5*   Estimated Creatinine Clearance: 82 mL/min (by C-G formula based on SCr of 0.84 mg/dL).   Microbiology: No results found for this or any previous visit (from the past 720 hour(s)).  Medical History: Past Medical History:  Diagnosis Date   Atrial fibrillation ([place])    chronic   Carcinoid tumor    Carotid artery dissection ([facility])    History of previous spontaneous carotid artery dissection   Diabetes mellitus    Hyperlipidemia    S/P small bowel resection    History of a partial bowel resection for a carcinoid   Seizure disorder ([facility])     Medications:  Medications Prior to Admission  Medication Sig Dispense Refill Last Dose   acetaminophen (TYLENOL) 325 MG tablet Take 650 mg by mouth every 6 (six) hours as needed for mild pain.   unk   atorvastatin (LIPITOR) 40 MG tablet Take 40 mg by mouth daily.   on hold   daptomycin (CUBICIN) IVPB Inject 500 mg into the vein daily.   07/08/2022 at 0800   digoxin (LANOXIN) 0.125 MG tablet Take 0.125 mg by mouth daily.   unk   diltiazem (DILACOR XR) 180 MG 24 hr capsule Take 180  mg by mouth daily.     07/08/2022 at 0800   DULoxetine (CYMBALTA) 30 MG capsule Take 30 mg by mouth daily.   unk   Empagliflozin-metFORMIN HCl ER (SYNJARDY XR) 12.01-999 MG TB24 Take 1 tablet by mouth 2 (two) times daily with a meal.   unk   fenofibrate 160 MG tablet Take 160 mg by mouth daily.   unk   Glucose (TRUEPLUS GLUCOSE) 15 GM/32ML GEL Take 32 mLs by mouth as needed (for hypoglycemia).   unk   insulin glargine (LANTUS) 100 UNIT/ML injection Inject 25 Units into the skin at bedtime.   unk   lamoTRIgine (LAMICTAL) 100 MG tablet Take 100 mg by mouth daily.   unk   lidocaine 4 % Place 1 patch onto the skin daily.   unk   methocarbamol (ROBAXIN) 500 MG tablet Take 750 mg by mouth 4 (four) times daily. For one month with last day on 07/26/22   unk   ondansetron (ZOFRAN) 4 MG tablet Take 4 mg by mouth every 8 (eight) hours as needed for nausea or vomiting.   unk   polyethylene glycol (MIRALAX / GLYCOLAX) 17 g packet Take 17 g by mouth daily.   unk  pregabalin (LYRICA) 100 MG capsule Take 100 mg by mouth at bedtime.   unk   promethazine (PHENERGAN) 25 MG/ML injection Inject 12.5 mg into the muscle every 6 (six) hours as needed for nausea or vomiting.   unk   senna (SENOKOT) 8.6 MG TABS tablet Take 2 tablets by mouth daily as needed for mild constipation.   unk   simethicone (MYLICON) 973 MG chewable tablet Chew 125 mg by mouth 4 (four) times daily - after meals and at bedtime.   unk   sodium phosphate Pediatric (FLEET) 3.5-9.5 GM/59ML enema Place 1 enema rectally daily as needed for severe constipation.   unk   tamsulosin (FLOMAX) 0.4 MG CAPS capsule Take 0.4 mg by mouth daily.   unk   traMADol (ULTRAM) 50 MG tablet Take 50 mg by mouth every 6 (six) hours as needed for severe pain. For seven days with last day on 07/14/22   unk   warfarin (COUMADIN) 7.5 MG tablet Take 7.5 mg by mouth daily.   unk    Assessment: Patient with hx seizure disorder on lamictal and pregabalin PTA. Admitted with  small bowel obstruction and currently NPO.   Pharmacy consulted to dose IV phenytoin while patient NPO. Baseline labs:  phenytoin level <2.5, Scr 0.84, albumin 3.3, LFTs WNL. Confirmed PTA seizure meds with nursing facility (he is not taking phenytoin).  Discussed with house coverage Raenette Rover, NP) that patient is not taking phenytoin PTA.  She discussed with patient and pharmacy was instructed to dose phenytoin IV while NPO/unable to take oral seizure medications.     Goal of Therapy:  Phenytoin level 10-20 mcg/ml  Plan:  -Phenytoin '1000mg'$  IV load x1 then '100mg'$  IV q8h while NPO -Check phenytoin level at steady state -Monitor for s/sx of phenytoin toxicity or seizure activity while on phenytoin -Resume Lamictal and Pregabalin once able to take PO meds  [name] PharmD 07/09/2022,12:29 AM

## 2022-07-09 NOTE — Progress Notes (Addendum)
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 Discussed case with ID - [place]. Repending OPAT orders informational for purposes of discharge back to SNF.   IV antibiotic discharge orders are pended. To discharging provider:  please sign these orders via discharge navigator,  Select New Orders & click on the button choice - Manage This Unsigned Work.     Thank you for allowing pharmacy to be a part of this patient's care.  Alycia Rossetti, PharmD, BCPS Infectious Diseases Clinical Pharmacist 07/09/2022 11:30 AM   **Pharmacist phone directory can now be found on [email or web address] ([email or web address]).  Listed under [place].

## 2022-07-10 DIAGNOSIS — K56609 Unspecified intestinal obstruction, unspecified as to partial versus complete obstruction: Secondary | ICD-10-CM | POA: Diagnosis not present

## 2022-07-10 LAB — CBC
HCT: 37.7 % — ABNORMAL LOW (ref 39.0–52.0)
Hemoglobin: 12.2 g/dL — ABNORMAL LOW (ref 13.0–17.0)
MCH: 28.6 pg (ref 26.0–34.0)
MCHC: 32.4 g/dL (ref 30.0–36.0)
MCV: 88.3 fL (ref 80.0–100.0)
Platelets: 261 10*3/uL (ref 150–400)
RBC: 4.27 MIL/uL (ref 4.22–5.81)
RDW: 13.6 % (ref 11.5–15.5)
WBC: 8.9 10*3/uL (ref 4.0–10.5)
nRBC: 0 % (ref 0.0–0.2)

## 2022-07-10 LAB — GLUCOSE, CAPILLARY
Glucose-Capillary: 123 mg/dL — ABNORMAL HIGH (ref 70–99)
Glucose-Capillary: 150 mg/dL — ABNORMAL HIGH (ref 70–99)
Glucose-Capillary: 195 mg/dL — ABNORMAL HIGH (ref 70–99)
Glucose-Capillary: 200 mg/dL — ABNORMAL HIGH (ref 70–99)
Glucose-Capillary: 260 mg/dL — ABNORMAL HIGH (ref 70–99)

## 2022-07-10 LAB — BASIC METABOLIC PANEL
Anion gap: 9 (ref 5–15)
BUN: 14 mg/dL (ref 8–23)
CO2: 27 mmol/L (ref 22–32)
Calcium: 8.3 mg/dL — ABNORMAL LOW (ref 8.9–10.3)
Chloride: 97 mmol/L — ABNORMAL LOW (ref 98–111)
Creatinine, Ser: 0.58 mg/dL — ABNORMAL LOW (ref 0.61–1.24)
GFR, Estimated: >60 mL/min (ref >60)
Glucose, Bld: 138 mg/dL — ABNORMAL HIGH (ref 70–99)
Potassium: 3.1 mmol/L — ABNORMAL LOW (ref 3.5–5.1)
Sodium: 133 mmol/L — ABNORMAL LOW (ref 135–145)

## 2022-07-10 LAB — PROTIME-INR
INR: 1.4 — ABNORMAL HIGH (ref 0.8–1.2)
Prothrombin Time: 17 seconds — ABNORMAL HIGH (ref 11.4–15.2)

## 2022-07-10 MED ORDER — POTASSIUM CHLORIDE 10 MEQ/100ML IV SOLN
INTRAVENOUS | Status: AC
  Filled 2022-07-10: qty 100

## 2022-07-10 MED ORDER — POTASSIUM CHLORIDE 10 MEQ/100ML IV SOLN
10.0000 meq | INTRAVENOUS | Status: AC
  Administered 2022-07-10 (×2): 10 meq via INTRAVENOUS
  Filled 2022-07-10: qty 100

## 2022-07-10 MED ORDER — WARFARIN SODIUM 2.5 MG PO TABS
10.0000 mg | ORAL_TABLET | Freq: Once | ORAL | Status: AC
  Administered 2022-07-10: 10 mg via ORAL
  Filled 2022-07-10: qty 4

## 2022-07-10 MED ORDER — POTASSIUM CHLORIDE CRYS ER 20 MEQ PO TBCR
40.0000 meq | EXTENDED_RELEASE_TABLET | Freq: Once | ORAL | Status: AC
  Administered 2022-07-10: 40 meq via ORAL
  Filled 2022-07-10: qty 2

## 2022-07-10 NOTE — Progress Notes (Signed)
No  BIOPATCH found on PICC  upon doing DBIV, RN stated that there was no BIO Patch on PICC since she received pt. At 7pm last night. This IV Nurse now at bedside to change dressing and apply BIOPATCH  but Pt. Requested to come back at 0900. Pt. Went back to sleep. RN made aware.

## 2022-07-10 NOTE — Progress Notes (Signed)
Subjective/Chief Complaint: Having bowel function, no n/v tol diet   Objective: Vital signs in last 24 hours: Temp:  [97.4 F (36.3 C)-98.2 F (36.8 C)] 98.2 F (36.8 C) (11/03 2114) Pulse Rate:  [79-97] 79 (11/03 2114) Resp:  [18] 18 (11/03 2114) BP: (112-130)/(76-91) 112/82 (11/03 2114) SpO2:  [99 %-100 %] 100 % (11/03 2114) Last BM Date : 07/09/22  Intake/Output from previous day: 11/03 0701 - 11/04 0700 In: 1740 [P.O.:1680; IV Piggyback:60] Out: 2150 [Urine:2150] Intake/Output this shift: No intake/output data recorded.  Ab soft nontender  Lab Results:  Recent Labs    07/09/22 0415 07/10/22 0337  WBC 10.9* 8.9  HGB 13.2 12.2*  HCT 42.2 37.7*  PLT 305 261   BMET Recent Labs    07/09/22 0415 07/10/22 0337  NA 136 133*  K 3.5 3.1*  CL 99 97*  CO2 24 27  GLUCOSE 95 138*  BUN 26* 14  CREATININE 0.58* 0.58*  CALCIUM 8.2* 8.3*   PT/INR Recent Labs    07/09/22 0415 07/10/22 0337  LABPROT 18.4* 17.0*  INR 1.5* 1.4*   ABG No results for input(s): "PHART", "HCO3" in the last 72 hours.  Invalid input(s): "PCO2", "PO2"  Studies/Results: DG Abd Portable 1V-Small Bowel Obstruction Protocol-initial, 8 hr delay  Result Date: 07/09/2022 CLINICAL DATA:  Small-bowel obstruction EXAM: PORTABLE ABDOMEN - 1 VIEW COMPARISON:  CT 07/08/2022 FINDINGS: Contrast opacifies numerous dilated loops of small bowel within the mid abdomen. However, oral contrast has now passed into the ascending colon and together the findings suggest the presence of a distal partial or resolving small bowel obstruction. Contrast opacifies the bladder lumen. No free intraperitoneal gas. IMPRESSION: 1. Findings suggestive of a distal partial or resolving small bowel obstruction with contrast now opacifying the ascending colon. Electronically Signed   By: Fidela Salisbury M.D.   On: 07/09/2022 02:38   DG Abd Portable 1V-Small Bowel Protocol-Position Verification  Result Date:  07/08/2022 CLINICAL DATA:  Enteric catheter placement EXAM: PORTABLE ABDOMEN - 1 VIEW COMPARISON:  07/08/2022 FINDINGS: Single frontal view of the chest demonstrates an unremarkable cardiac silhouette. Left-sided PICC tip overlies the superior vena cava. Enteric catheter passes below diaphragm tip and side port projecting over gastric fundus. No airspace disease, effusion, or pneumothorax. No acute bony abnormality. IMPRESSION: 1. Enteric catheter tip projecting over gastric fundus. 2. No acute intrathoracic process. Electronically Signed   By: Randa Ngo M.D.   On: 07/08/2022 17:17   CT ABDOMEN PELVIS W CONTRAST  Result Date: 07/08/2022 CLINICAL DATA:  Mid abdominal pain for about 1 month. Reportedly gallstones were found on an outside ultrasound this morning. EXAM: CT ABDOMEN AND PELVIS WITH CONTRAST TECHNIQUE: Multidetector CT imaging of the abdomen and pelvis was performed using the standard protocol following bolus administration of intravenous contrast. RADIATION DOSE REDUCTION: This exam was performed according to the departmental dose-optimization program which includes automated exposure control, adjustment of the mA and/or kV according to patient size and/or use of iterative reconstruction technique. CONTRAST:  161m OMNIPAQUE IOHEXOL 300 MG/ML  SOLN COMPARISON:  CT scan 05/20/2006 FINDINGS: Lower chest: Bandlike atelectasis or scarring in both lower lobes. Borderline cardiomegaly. Right coronary artery atherosclerotic calcification. Hepatobiliary: 1.4 cm gallstone in the gallbladder, image 37 series 2. No gallbladder wall thickening or pericholecystic fluid. Hepatic parenchyma unremarkable. No biliary dilatation. Pancreas: Unremarkable Spleen: Unremarkable Adrenals/Urinary Tract: Prominent median lobe of prostate gland indents the bladder base. The kidneys and adrenal glands appeared unremarkable. Stomach/Bowel: Small bowel obstruction with dilated loops of  small bowel extending to a sac-like  collection of bowel loops in the right upper abdomen suspicious for internal hernia, transition point shown on images 51 through 57 of series 5 where the dilated loop approaches the sac of nondilated loops. Partial left colectomy and also evidence of prior small bowel surgery on the right. The dilated loops currently measure up to 4.6 cm. No findings of pneumatosis or extraluminal gas. No definite hypoenhancing bowel segments. There is also an air-level in the rectum which may indicate diarrheal process. Vascular/Lymphatic: Atherosclerosis is present, including aortoiliac atherosclerotic disease. Reproductive: Prostatomegaly indenting the bladder base. Other: Trace ascites in the vicinity of the left inguinal ring, image 72 series 2. Musculoskeletal: Levoconvex lumbar scoliosis. Irregular bony destructive type findings along the L1-2 intervertebral disc space which is substantially widened with some flattening and reduced height especially of L1 and to a lesser degree of L2, appearance concerning for possible discitis-osteomyelitis. Degenerative findings at the remaining lumbar levels. Suspected right foraminal impingement at L1-2, L2-3, L3-4, L5-S1; and left foraminal impingement at L4-5 at L5-S1. IMPRESSION: 1. High-grade small bowel obstruction with transition point in the right upper abdomen where there is a sac-like collection of bowel loops suspicious for internal hernia. No pneumatosis or extraluminal gas. 2. Abnormal bony destructive findings along the L1-2 intervertebral disc space which is substantially widened with some flattening and reduced height of L1 and to a lesser degree of L2, appearance concerning for discitis-osteomyelitis. Lumbar MRI could be utilized for further characterization. 3. Air-level in the rectum which may indicate diarrheal process. 4. Cholelithiasis. 5. Prostatomegaly indenting the bladder base. 6. Multilevel impingement in the lumbar spine. 7. Borderline cardiomegaly with right  coronary artery atherosclerotic calcification. 8. Trace ascites near the left inguinal ring. 9. Aortic atherosclerosis. 10. Bandlike atelectasis or scarring in both lower lobes. 11. Partial left colectomy and also evidence of prior small bowel surgery on the right. Aortic Atherosclerosis (ICD10-I70.0). Electronically Signed   By: Van Clines M.D.   On: 07/08/2022 14:03    Anti-infectives: Anti-infectives (From admission, onward)    Start     Dose/Rate Route Frequency Ordered Stop   07/09/22 0900  DAPTOmycin (CUBICIN) 500 mg in sodium chloride 0.9 % IVPB        500 mg 120 mL/hr over 30 Minutes Intravenous Every 24 hours 07/08/22 2245         Assessment/Plan: SBO resolved -regular diet -will sign off   Rolm Bookbinder 07/10/2022

## 2022-07-10 NOTE — Progress Notes (Signed)
[place] for Warfarin, Lovenox Indication: atrial fibrillation  Allergies  Allergen Reactions   Oseltamivir Phosphate     Patient Measurements: Height: '5\' 10"'$  (177.8 cm) Weight: 71.9 kg (158 lb 8.2 oz) IBW/kg (Calculated) : 73 Heparin Dosing Weight:   Vital Signs: Temp: 98.2 F (36.8 C) (11/03 2114) Temp Source: Oral (11/03 2114) BP: 112/82 (11/03 2114) Pulse Rate: 79 (11/03 2114)  Labs: Recent Labs    07/08/22 1207 07/08/22 1235 07/09/22 0415 07/10/22 0337  HGB 15.0  --  13.2 12.2*  HCT 47.3  --  42.2 37.7*  PLT 364  --  305 261  LABPROT 35.0*  --  18.4* 17.0*  INR 3.5*  --  1.5* 1.4*  CREATININE  --  0.84 0.58* 0.58*  CKTOTAL  --   --  41*  --      Estimated Creatinine Clearance: 86.1 mL/min (A) (by C-G formula based on SCr of 0.58 mg/dL (L)).   Medications:  Scheduled:   Chlorhexidine Gluconate Cloth  6 each Topical Daily   digoxin  0.125 mg Oral Daily   diltiazem  180 mg Oral Daily   DULoxetine  30 mg Oral Daily   enoxaparin (LOVENOX) injection  70 mg Subcutaneous Q12H   feeding supplement  1 Container Oral TID BM   fenofibrate  160 mg Oral Daily   insulin glargine-yfgn  5 Units Subcutaneous QHS   lamoTRIgine  100 mg Oral Daily   melatonin  3 mg Oral QHS   methocarbamol  750 mg Oral QID   pantoprazole (PROTONIX) IV  40 mg Intravenous Q24H   potassium chloride  40 mEq Oral Once   pregabalin  100 mg Oral QHS   sodium chloride flush  10-40 mL Intracatheter Q12H   tamsulosin  0.4 mg Oral Daily   Warfarin - Pharmacist Dosing Inpatient   Does not apply q1600   Infusions:   DAPTOmycin (CUBICIN) 500 mg in sodium chloride 0.9 % IVPB 500 mg (07/09/22 1025)   potassium chloride      Assessment: 56 yoM presented to ED on 11/2 with SBO.  PMH is significant for Afib on chronic warfarin which was held on admission for possible surgical intervention.  GI function has now improved and pharmacy is consulted to resume  Warfarin dosing and bridge with Lovenox.    PTA warfarin 7.5 mg daily.  LD on 11/1.  Admit INR 3.5 - Reversal:  Vit K '10mg'$  IV on 11/2  Today, 07/10/2022: INR 1.4 CBC:  Hgb decreased to 12.2, Plt WNL No bleeding or complications reported Diet: advanced to soft.  Stool output: 4 BMs yesterday.  No surgical plans at this time. Drug-drug interactions: no new interactions, but expect that VitK given on 11/2 may suppress INR for several days.    Goal of Therapy:  INR 2-3 Monitor platelets by anticoagulation protocol: Yes   Plan:  Continue enoxaparin 1 mg/kg ('70mg'$ ) SQ q12h Warfarin 10 mg PO x 1  Daily PT/INR. Monitor for signs and symptoms of bleeding.   Gretta Arab PharmD, BCPS [facility] main pharmacy [phone] 07/10/2022 8:23 AM

## 2022-07-10 NOTE — Progress Notes (Signed)
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  Patient presented after having abdominal pain and emesis for last 4 days, last appointment 11/1, no fever chills chest pain.  In the ED vitals fairly stable with tachycardia, labs with leukocytosis, metabolic acidosis bicarb 20, total bilirubin 1.5 normal lactic acid INR 3.5. CT abdomen pelvis with contrast: High-grade SBO transition point right upper abdomen with 3 cystic-like collection of bowel loops suspicious for internal hernia, bony destructive finding L1-2 intervertebral disc space substantially widened with some flattening concerning for discitis/osteomyelitis cholelithiasis elevated rectum indicating diarrheal process, prostatomegaly cardiomegaly aortic atherosclerosis scaring of lower lobes partial left colectomy. General surgery consulted and was admitted Managed with IV fluids NG tube decompression and resolved.  Now on oral diet   Subjective: Seen and examined this morning patient resting comfortably has had bowel movements. Overnight no fever INR subtherapeutic   Assessment and Plan: High-grade small bowel obstruction history of small bowel obstruction, partial colectomy: Seen by surgery S/P IV fluids NG tube decompression, resolved, tolerating diet on soft diet.    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  CK stable, leukocytosis resolved and afebrile.  Continue tramadol, Lyrica, pain medication  Cymbalta  Chronic A-fib on Coumadin, with supratherapeutic INR: Coumadin reversed due to SBO.  INR subtherapeutic> started Lovenox to bridge 11/3- pharmacy to dose both Recent Labs  Lab 07/08/22 1207 07/09/22 0415 07/10/22 0337  INR 3.5* 1.5* 1.4*    Seizure disorder: cont home phenytoin/Lamictal Type 2 diabetes mellitus with peripheral neuropathy: Blood sugar controlled  cont ssi, Lantus 5 units.  At home on 25 units. Recent Labs  Lab 07/09/22 0424 07/09/22 1131 07/09/22 1753 07/10/22 0018 07/10/22 0538  GLUCAP 105* 137* 123* 195* 123*    Hyperlipidemia: Statin is on hold while on daptomycin- cont fenofibrate   DVT prophylaxis: Lovenox/Coumadin Code Status:   Code Status: Full Code Family Communication: plan of care discussed with patient at bedside. Patient status is: Inpatient because of bowel obstruction Level of care: Telemetry   Dispo: The patient is from: SNF            Anticipated disposition: SNF-he wants to go to [facility] for discharge once bed available  Mobility Assessment (last 72 hours)     Mobility Assessment     Row Name 07/09/22 2205 07/08/22 2000 07/08/22 1800       Does patient have an order for bedrest or is patient medically unstable No - Continue assessment No - Continue assessment No - Continue assessment     What is the highest level of mobility based on the progressive mobility assessment? Level 2 (Chairfast) - Balance while sitting on edge of bed and cannot stand Level 2 (Chairfast) - Balance while sitting on edge of bed and cannot stand --     Is the above level different from baseline mobility prior to current illness? Yes - Recommend PT order -- --               Objective: Vitals last 24 hrs: Vitals:   07/09/22 0539 07/09/22 1003 07/09/22 1355  07/09/22 2114  BP: 120/83 (!) 130/91 124/76 112/82  Pulse: 85 97 97 79  Resp:  '18 18 18  '$ Temp: 97.6 F (36.4 C) (!) 97.4 F (36.3 C) 97.8 F (36.6 C) 98.2  F (36.8 C)  TempSrc: Oral Oral Oral Oral  SpO2: 98% 99% 99% 100%  Weight:      Height:       Weight change:   Physical Examination: General exam: alert awake, oriented, older than stated age HEENT:Oral mucosa moist, Ear/Nose WNL grossly Respiratory system: Bilaterally clear BS, no use of accessory muscle Cardiovascular system: S1 & S2 +, No JVD. Gastrointestinal system: Abdomen soft,NT,ND, BS+ Nervous System: Alert, awake, moving extremities, hefollows commands. Extremities: LE edema neg,distal peripheral pulses palpable.  Skin: No rashes,no icterus. MSK: Normal muscle bulk,tone, power   Medications reviewed:  Scheduled Meds:  Chlorhexidine Gluconate Cloth  6 each Topical Daily   digoxin  0.125 mg Oral Daily   diltiazem  180 mg Oral Daily   DULoxetine  30 mg Oral Daily   enoxaparin (LOVENOX) injection  70 mg Subcutaneous Q12H   feeding supplement  1 Container Oral TID BM   fenofibrate  160 mg Oral Daily   insulin glargine-yfgn  5 Units Subcutaneous QHS   lamoTRIgine  100 mg Oral Daily   melatonin  3 mg Oral QHS   methocarbamol  750 mg Oral QID   pantoprazole (PROTONIX) IV  40 mg Intravenous Q24H   potassium chloride  40 mEq Oral Once   pregabalin  100 mg Oral QHS   sodium chloride flush  10-40 mL Intracatheter Q12H   tamsulosin  0.4 mg Oral Daily   Warfarin - Pharmacist Dosing Inpatient   Does not apply q1600   Continuous Infusions:  DAPTOmycin (CUBICIN) 500 mg in sodium chloride 0.9 % IVPB 500 mg (07/09/22 1025)   potassium chloride        Diet Order             DIET SOFT Room service appropriate? Yes; Fluid consistency: Thin  Diet effective now                  Intake/Output Summary (Last 24 hours) at 07/10/2022 0735 Last data filed at 07/10/2022 0646 Gross per 24 hour  Intake 1740 ml  Output 2150 ml  Net -410 ml   Net IO Since Admission: 1,621.82 mL [07/10/22 0735]  Wt Readings from Last 3 Encounters:  07/08/22 71.9 kg     Unresulted Labs (From  admission, onward)     Start     Ordered   07/10/22 3810  Basic metabolic panel  Daily at 5am,   R     Question:  Specimen collection method  Answer:  IV Team=IV Team collect   07/09/22 0755   07/10/22 0500  CBC  Daily at 5am,   R     Question:  Specimen collection method  Answer:  IV Team=IV Team collect   07/09/22 0755   07/09/22 0500  Protime-INR  Daily at 5am,   R      07/08/22 1635   07/09/22 0500  CK  Every Friday,   R      07/08/22 2245          Data Reviewed: I have personally reviewed following labs and imaging studies CBC: Recent Labs  Lab 07/08/22 1207 07/09/22 0415 07/10/22 0337  WBC 13.8* 10.9* 8.9  NEUTROABS 11.0*  --   --   HGB 15.0 13.2 12.2*  HCT 47.3  42.2 37.7*  MCV 90.6 90.6 88.3  PLT 364 305 474   Basic Metabolic Panel: Recent Labs  Lab 07/08/22 1235 07/09/22 0415 07/10/22 0337  NA 137 136 133*  K 4.2 3.5 3.1*  CL 99 99 97*  CO2 20* 24 27  GLUCOSE 133* 95 138*  BUN 31* 26* 14  CREATININE 0.84 0.58* 0.58*  CALCIUM 8.9 8.2* 8.3*  MG 2.0  --   --   GFR: Estimated Creatinine Clearance: 86.1 mL/min (A) (by C-G formula based on SCr of 0.58 mg/dL (L)). Liver Function Tests: Recent Labs  Lab 07/08/22 1235 07/09/22 0415  AST 19 17  ALT 17 15  ALKPHOS 74 65  BILITOT 1.5* 1.4*  PROT 6.9 6.0*  ALBUMIN 3.3* 3.0*   Recent Labs  Lab 07/08/22 1235  LIPASE 24  Coagulation Profile: Recent Labs  Lab 07/08/22 1207 07/09/22 0415 07/10/22 0337  INR 3.5* 1.5* 1.4*  CBG: Recent Labs  Lab 07/09/22 0424 07/09/22 1131 07/09/22 1753 07/10/22 0018 07/10/22 0538  GLUCAP 105* 137* 123* 195* 123*   Recent Labs  Lab 07/08/22 1407 07/08/22 2016  LATICACIDVEN 1.7 1.4    No results found for this or any previous visit (from the past 240 hour(s)).  Antimicrobials: Anti-infectives (From admission, onward)    Start     Dose/Rate Route Frequency Ordered Stop   07/09/22 0900  DAPTOmycin (CUBICIN) 500 mg in sodium chloride 0.9 % IVPB        500  mg 120 mL/hr over 30 Minutes Intravenous Every 24 hours 07/08/22 2245        Culture/Microbiology No results found for: "SDES", "SPECREQUEST", "CULT", "REPTSTATUS"  Other culture-see note  Radiology Studies: DG Abd Portable 1V-Small Bowel Obstruction Protocol-initial, 8 hr delay  Result Date: 07/09/2022 CLINICAL DATA:  Small-bowel obstruction EXAM: PORTABLE ABDOMEN - 1 VIEW COMPARISON:  CT 07/08/2022 FINDINGS: Contrast opacifies numerous dilated loops of small bowel within the mid abdomen. However, oral contrast has now passed into the ascending colon and together the findings suggest the presence of a distal partial or resolving small bowel obstruction. Contrast opacifies the bladder lumen. No free intraperitoneal gas. IMPRESSION: 1. Findings suggestive of a distal partial or resolving small bowel obstruction with contrast now opacifying the ascending colon. Electronically Signed   By: Fidela Salisbury M.D.   On: 07/09/2022 02:38   DG Abd Portable 1V-Small Bowel Protocol-Position Verification  Result Date: 07/08/2022 CLINICAL DATA:  Enteric catheter placement EXAM: PORTABLE ABDOMEN - 1 VIEW COMPARISON:  07/08/2022 FINDINGS: Single frontal view of the chest demonstrates an unremarkable cardiac silhouette. Left-sided PICC tip overlies the superior vena cava. Enteric catheter passes below diaphragm tip and side port projecting over gastric fundus. No airspace disease, effusion, or pneumothorax. No acute bony abnormality. IMPRESSION: 1. Enteric catheter tip projecting over gastric fundus. 2. No acute intrathoracic process. Electronically Signed   By: Randa Ngo M.D.   On: 07/08/2022 17:17   CT ABDOMEN PELVIS W CONTRAST  Result Date: 07/08/2022 CLINICAL DATA:  Mid abdominal pain for about 1 month. Reportedly gallstones were found on an outside ultrasound this morning. EXAM: CT ABDOMEN AND PELVIS WITH CONTRAST TECHNIQUE: Multidetector CT imaging of the abdomen and pelvis was performed using the  standard protocol following bolus administration of intravenous contrast. RADIATION DOSE REDUCTION: This exam was performed according to the departmental dose-optimization program which includes automated exposure control, adjustment of the mA and/or kV according to patient size and/or use of iterative reconstruction technique. CONTRAST:  162m OMNIPAQUE IOHEXOL 300  MG/ML  SOLN COMPARISON:  CT scan 05/20/2006 FINDINGS: Lower chest: Bandlike atelectasis or scarring in both lower lobes. Borderline cardiomegaly. Right coronary artery atherosclerotic calcification. Hepatobiliary: 1.4 cm gallstone in the gallbladder, image 37 series 2. No gallbladder wall thickening or pericholecystic fluid. Hepatic parenchyma unremarkable. No biliary dilatation. Pancreas: Unremarkable Spleen: Unremarkable Adrenals/Urinary Tract: Prominent median lobe of prostate gland indents the bladder base. The kidneys and adrenal glands appeared unremarkable. Stomach/Bowel: Small bowel obstruction with dilated loops of small bowel extending to a sac-like collection of bowel loops in the right upper abdomen suspicious for internal hernia, transition point shown on images 51 through 57 of series 5 where the dilated loop approaches the sac of nondilated loops. Partial left colectomy and also evidence of prior small bowel surgery on the right. The dilated loops currently measure up to 4.6 cm. No findings of pneumatosis or extraluminal gas. No definite hypoenhancing bowel segments. There is also an air-level in the rectum which may indicate diarrheal process. Vascular/Lymphatic: Atherosclerosis is present, including aortoiliac atherosclerotic disease. Reproductive: Prostatomegaly indenting the bladder base. Other: Trace ascites in the vicinity of the left inguinal ring, image 72 series 2. Musculoskeletal: Levoconvex lumbar scoliosis. Irregular bony destructive type findings along the L1-2 intervertebral disc space which is substantially widened with some  flattening and reduced height especially of L1 and to a lesser degree of L2, appearance concerning for possible discitis-osteomyelitis. Degenerative findings at the remaining lumbar levels. Suspected right foraminal impingement at L1-2, L2-3, L3-4, L5-S1; and left foraminal impingement at L4-5 at L5-S1. IMPRESSION: 1. High-grade small bowel obstruction with transition point in the right upper abdomen where there is a sac-like collection of bowel loops suspicious for internal hernia. No pneumatosis or extraluminal gas. 2. Abnormal bony destructive findings along the L1-2 intervertebral disc space which is substantially widened with some flattening and reduced height of L1 and to a lesser degree of L2, appearance concerning for discitis-osteomyelitis. Lumbar MRI could be utilized for further characterization. 3. Air-level in the rectum which may indicate diarrheal process. 4. Cholelithiasis. 5. Prostatomegaly indenting the bladder base. 6. Multilevel impingement in the lumbar spine. 7. Borderline cardiomegaly with right coronary artery atherosclerotic calcification. 8. Trace ascites near the left inguinal ring. 9. Aortic atherosclerosis. 10. Bandlike atelectasis or scarring in both lower lobes. 11. Partial left colectomy and also evidence of prior small bowel surgery on the right. Aortic Atherosclerosis (ICD10-I70.0). Electronically Signed   By: Van Clines M.D.   On: 07/08/2022 14:03     LOS: 2 days   Antonieta Pert, MD [facility]  07/10/2022, 7:35 AM

## 2022-07-11 DIAGNOSIS — K56609 Unspecified intestinal obstruction, unspecified as to partial versus complete obstruction: Secondary | ICD-10-CM | POA: Diagnosis not present

## 2022-07-11 LAB — BASIC METABOLIC PANEL
Anion gap: 7 (ref 5–15)
BUN: 10 mg/dL (ref 8–23)
CO2: 28 mmol/L (ref 22–32)
Calcium: 8.5 mg/dL — ABNORMAL LOW (ref 8.9–10.3)
Chloride: 103 mmol/L (ref 98–111)
Creatinine, Ser: 0.49 mg/dL — ABNORMAL LOW (ref 0.61–1.24)
GFR, Estimated: >60 mL/min (ref >60)
Glucose, Bld: 125 mg/dL — ABNORMAL HIGH (ref 70–99)
Potassium: 3.3 mmol/L — ABNORMAL LOW (ref 3.5–5.1)
Sodium: 138 mmol/L (ref 135–145)

## 2022-07-11 LAB — CBC
HCT: 37.7 % — ABNORMAL LOW (ref 39.0–52.0)
Hemoglobin: 12.4 g/dL — ABNORMAL LOW (ref 13.0–17.0)
MCH: 29 pg (ref 26.0–34.0)
MCHC: 32.9 g/dL (ref 30.0–36.0)
MCV: 88.1 fL (ref 80.0–100.0)
Platelets: 237 10*3/uL (ref 150–400)
RBC: 4.28 MIL/uL (ref 4.22–5.81)
RDW: 13.7 % (ref 11.5–15.5)
WBC: 5.6 10*3/uL (ref 4.0–10.5)
nRBC: 0 % (ref 0.0–0.2)

## 2022-07-11 LAB — GLUCOSE, CAPILLARY
Glucose-Capillary: 136 mg/dL — ABNORMAL HIGH (ref 70–99)
Glucose-Capillary: 202 mg/dL — ABNORMAL HIGH (ref 70–99)
Glucose-Capillary: 222 mg/dL — ABNORMAL HIGH (ref 70–99)
Glucose-Capillary: 220 mg/dL — ABNORMAL HIGH (ref 70–99)

## 2022-07-11 LAB — PROTIME-INR
INR: 2.2 — ABNORMAL HIGH (ref 0.8–1.2)
Prothrombin Time: 24 seconds — ABNORMAL HIGH (ref 11.4–15.2)

## 2022-07-11 MED ORDER — POTASSIUM CHLORIDE CRYS ER 20 MEQ PO TBCR
40.0000 meq | EXTENDED_RELEASE_TABLET | Freq: Once | ORAL | Status: AC
  Administered 2022-07-11: 40 meq via ORAL
  Filled 2022-07-11: qty 2

## 2022-07-11 MED ORDER — PANTOPRAZOLE SODIUM 40 MG PO TBEC
40.0000 mg | DELAYED_RELEASE_TABLET | Freq: Every day | ORAL | Status: DC
  Administered 2022-07-11 – 2022-07-12 (×2): 40 mg via ORAL
  Filled 2022-07-11 (×2): qty 1

## 2022-07-11 MED ORDER — WARFARIN SODIUM 2.5 MG PO TABS
5.0000 mg | ORAL_TABLET | Freq: Once | ORAL | Status: AC
  Administered 2022-07-11: 5 mg via ORAL
  Filled 2022-07-11: qty 2

## 2022-07-11 MED ORDER — POTASSIUM CHLORIDE CRYS ER 20 MEQ PO TBCR
20.0000 meq | EXTENDED_RELEASE_TABLET | Freq: Every day | ORAL | Status: DC
  Administered 2022-07-11 – 2022-07-12 (×2): 20 meq via ORAL
  Filled 2022-07-11 (×3): qty 1

## 2022-07-11 NOTE — Plan of Care (Signed)
  Problem: Clinical Measurements: Goal: Ability to maintain clinical measurements within normal limits will improve Outcome: Progressing   Problem: Pain Managment: Goal: General experience of comfort will improve Outcome: Progressing   

## 2022-07-11 NOTE — Progress Notes (Signed)
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  Patient presented after having abdominal pain and emesis for last 4 days, last appointment 11/1, no fever chills chest pain.  In the ED vitals fairly stable with tachycardia, labs with leukocytosis, metabolic acidosis bicarb 20, total bilirubin 1.5 normal lactic acid INR 3.5. CT abdomen pelvis with contrast: High-grade SBO transition point right upper abdomen with 3 cystic-like collection of bowel loops suspicious for internal hernia, bony destructive finding L1-2 intervertebral disc space substantially widened with some flattening concerning for discitis/osteomyelitis cholelithiasis elevated rectum indicating diarrheal process, prostatomegaly cardiomegaly aortic atherosclerosis scaring of lower lobes partial left colectomy. General surgery consulted and was admitted Managed with IV fluids NG tube decompression and resolved.  Now on oral diet   Subjective: Seen and examined.  Resting comfortably no complaints.   Having bowel movements.  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  CK stable, leukocytosis resolved and afebrile.  Some back pain which is baseline and not worsening, continue on tramadol, Lyrica, pain medication  Cymbalta  Chronic A-fib on Coumadin, with supratherapeutic INR: Coumadin reversed due to SBO.  INRtherapeutic> continue Coumadin discontinue Lovenox.  Recent Labs  Lab 07/08/22 1207 07/09/22 0415 07/10/22 0337 07/11/22 0256  INR 3.5* 1.5* 1.4* 2.2*     Seizure disorder: cont home phenytoin/Lamictal Type 2 diabetes mellitus with peripheral neuropathy: Blood sugar controlled  cont ssi, Lantus 5 units.  At home on 25 units. Recent Labs  Lab 07/10/22 0538 07/10/22 1136 07/10/22 1819 07/10/22 2351 07/11/22 0504  GLUCAP 123* 200* 260* 150* 136*     Hyperlipidemia: Statin is on hold while on daptomycin- cont fenofibrate   DVT prophylaxis: Lovenox/Coumadin Code Status:   Code Status: Full Code Family Communication: plan of care discussed with patient at bedside. Patient status is: Inpatient because of bowel obstruction Level of care: Telemetry   Dispo: The patient is from: SNF            Anticipated disposition: SNF-he wants to go to [facility] consulted-stable for discharge once bed available  Mobility Assessment (last 72 hours)     Mobility Assessment     Row Name 07/10/22 2030 07/10/22 0730 07/09/22 2205 07/08/22 2000 07/08/22 1800   Does patient have an order for bedrest or is patient medically unstable No - Continue assessment No - Continue assessment No - Continue assessment No - Continue assessment No - Continue assessment   What is the highest level of mobility based on the progressive mobility assessment? Level 2 (Chairfast) - Balance while sitting on edge of bed and cannot stand Level 2 (Chairfast) - Balance while sitting on edge of bed and cannot stand Level 2 (Chairfast) - Balance while sitting on edge of bed and cannot stand Level 2 (Chairfast) - Balance while sitting on edge of bed and cannot stand --   Is the above level different from baseline mobility prior to current illness? Yes -  Recommend PT order Yes - Recommend PT order Yes - Recommend PT order  -- --             Objective: Vitals last 24 hrs: Vitals:   07/10/22 1319 07/10/22 1821 07/10/22 2108 07/11/22 0502  BP: (!) 143/93 132/77 126/79 136/82  Pulse: 98 85 77 72  Resp: '14 16 12 12  '$ Temp: 97.6 F (36.4 C) 98 F (36.7 C) 97.7 F (36.5 C) 97.8 F (36.6 C)  TempSrc: Oral Oral Oral Oral  SpO2: 100% 98% 100% 97%  Weight:      Height:       Weight change:   Physical Examination: General exam: alert awake, oriented, older than stated age HEENT:Oral mucosa moist, Ear/Nose WNL grossly Respiratory system: Bilaterally clear BS, no use of accessory muscle Cardiovascular system: S1 & S2 +, No JVD. Gastrointestinal system: Abdomen soft,NT,ND, BS+ Nervous System: Alert, awake, moving all 4 extremities Extremities: LE edema negative,distal peripheral pulses palpable.  Skin: No rashes,no icterus. MSK: Normal muscle bulk,tone, power   Medications reviewed:  Scheduled Meds:  Chlorhexidine Gluconate Cloth  6 each Topical Daily   digoxin  0.125 mg Oral Daily   diltiazem  180 mg Oral Daily   DULoxetine  30 mg Oral Daily   feeding supplement  1 Container Oral TID BM   fenofibrate  160 mg Oral Daily   insulin glargine-yfgn  5 Units Subcutaneous QHS   lamoTRIgine  100 mg Oral Daily   melatonin  3 mg Oral QHS   methocarbamol  750 mg Oral QID   pantoprazole (PROTONIX) IV  40 mg Intravenous Q24H   potassium chloride  20 mEq Oral Daily   pregabalin  100 mg Oral QHS   sodium chloride flush  10-40 mL Intracatheter Q12H   tamsulosin  0.4 mg Oral Daily   Warfarin - Pharmacist Dosing Inpatient   Does not apply q1600   Continuous Infusions:  DAPTOmycin (CUBICIN) 500 mg in sodium chloride 0.9 % IVPB 500 mg (07/11/22 0949)      Diet Order             DIET SOFT Room service appropriate? Yes; Fluid consistency: Thin  Diet effective now                  Intake/Output Summary (Last 24 hours) at 07/11/2022 1044 Last data filed at 07/11/2022 1000 Gross per 24 hour  Intake  1040 ml  Output 1950 ml  Net -910 ml    Net IO Since Admission: 591.82 mL [07/11/22 1044]  Wt Readings from Last 3 Encounters:  07/08/22 71.9 kg     Unresulted Labs (From admission, onward)     Start     Ordered   07/10/22 5361  Basic metabolic panel  Daily at 5am,   R     Question:  Specimen collection method  Answer:  IV Team=IV Team collect   07/09/22 0755   07/10/22 0500  CBC  Daily at 5am,   R     Question:  Specimen collection method  Answer:  IV Team=IV Team collect   07/09/22 0755   07/09/22 0500  Protime-INR  Daily at 5am,   R      07/08/22 1635   07/09/22 0500  CK  Every Friday,   R      07/08/22 2245          Data Reviewed: I have personally reviewed following labs and imaging studies CBC: Recent Labs  Lab 07/08/22 1207 07/09/22  7353 07/10/22 0337 07/11/22 0256  WBC 13.8* 10.9* 8.9 5.6  NEUTROABS 11.0*  --   --   --   HGB 15.0 13.2 12.2* 12.4*  HCT 47.3 42.2 37.7* 37.7*  MCV 90.6 90.6 88.3 88.1  PLT 364 305 261 299    Basic Metabolic Panel: Recent Labs  Lab 07/08/22 1235 07/09/22 0415 07/10/22 0337 07/11/22 0256  NA 137 136 133* 138  K 4.2 3.5 3.1* 3.3*  CL 99 99 97* 103  CO2 20* '24 27 28  '$ GLUCOSE 133* 95 138* 125*  BUN 31* 26* 14 10  CREATININE 0.84 0.58* 0.58* 0.49*  CALCIUM 8.9 8.2* 8.3* 8.5*  MG 2.0  --   --   --    GFR: Estimated Creatinine Clearance: 86.1 mL/min (A) (by C-G formula based on SCr of 0.49 mg/dL (L)). Liver Function Tests: Recent Labs  Lab 07/08/22 1235 07/09/22 0415  AST 19 17  ALT 17 15  ALKPHOS 74 65  BILITOT 1.5* 1.4*  PROT 6.9 6.0*  ALBUMIN 3.3* 3.0*    Recent Labs  Lab 07/08/22 1235  LIPASE 24   Coagulation Profile: Recent Labs  Lab 07/08/22 1207 07/09/22 0415 07/10/22 0337 07/11/22 0256  INR 3.5* 1.5* 1.4* 2.2*   CBG: Recent Labs  Lab 07/10/22 0538 07/10/22 1136 07/10/22 1819 07/10/22 2351 07/11/22 0504  GLUCAP 123* 200* 260* 150* 136*    Recent Labs  Lab 07/08/22 1407  07/08/22 2016  LATICACIDVEN 1.7 1.4     No results found for this or any previous visit (from the past 240 hour(s)).  Antimicrobials: Anti-infectives (From admission, onward)    Start     Dose/Rate Route Frequency Ordered Stop   07/09/22 0900  DAPTOmycin (CUBICIN) 500 mg in sodium chloride 0.9 % IVPB        500 mg 120 mL/hr over 30 Minutes Intravenous Every 24 hours 07/08/22 2245        Culture/Microbiology No results found for: "SDES", "SPECREQUEST", "CULT", "REPTSTATUS"  Other culture-see note  Radiology Studies: No results found.   LOS: 3 days   Antonieta Pert, MD [facility]  07/11/2022, 10:44 AM

## 2022-07-11 NOTE — Progress Notes (Signed)
Mobility Specialist - Progress Note   07/11/22 1445  Mobility  Activity Ambulated with assistance in hallway  Level of Assistance Modified independent, requires aide device or extra time  Assistive Device Front wheel walker  Distance Ambulated (ft) 500 ft  Activity Response Tolerated well  Mobility Referral Yes  $Mobility charge 1 Mobility   Pt received in bed and agreed to mobility, no c/o pain nor discomfort during session. Pt back to chair with all needs met and family in room.   Roderick Pee Mobility Specialist

## 2022-07-11 NOTE — Progress Notes (Addendum)
[place] for Warfarin Indication: atrial fibrillation  Allergies  Allergen Reactions   Oseltamivir Phosphate     Patient Measurements: Height: '5\' 10"'$  (177.8 cm) Weight: 71.9 kg (158 lb 8.2 oz) IBW/kg (Calculated) : 73 Heparin Dosing Weight:   Vital Signs: Temp: 97.8 F (36.6 C) (11/05 0502) Temp Source: Oral (11/05 0502) BP: 136/82 (11/05 0502) Pulse Rate: 72 (11/05 0502)  Labs: Recent Labs    07/09/22 0415 07/10/22 0337 07/11/22 0256  HGB 13.2 12.2* 12.4*  HCT 42.2 37.7* 37.7*  PLT 305 261 237  LABPROT 18.4* 17.0* 24.0*  INR 1.5* 1.4* 2.2*  CREATININE 0.58* 0.58* 0.49*  CKTOTAL 41*  --   --      Estimated Creatinine Clearance: 86.1 mL/min (A) (by C-G formula based on SCr of 0.49 mg/dL (L)).   Medications:  Scheduled:   Chlorhexidine Gluconate Cloth  6 each Topical Daily   digoxin  0.125 mg Oral Daily   diltiazem  180 mg Oral Daily   DULoxetine  30 mg Oral Daily   feeding supplement  1 Container Oral TID BM   fenofibrate  160 mg Oral Daily   insulin glargine-yfgn  5 Units Subcutaneous QHS   lamoTRIgine  100 mg Oral Daily   melatonin  3 mg Oral QHS   methocarbamol  750 mg Oral QID   pantoprazole (PROTONIX) IV  40 mg Intravenous Q24H   potassium chloride  20 mEq Oral Daily   pregabalin  100 mg Oral QHS   sodium chloride flush  10-40 mL Intracatheter Q12H   tamsulosin  0.4 mg Oral Daily   Warfarin - Pharmacist Dosing Inpatient   Does not apply q1600   Infusions:   DAPTOmycin (CUBICIN) 500 mg in sodium chloride 0.9 % IVPB 500 mg (07/11/22 0949)    Assessment: 9 yoM presented to ED on 11/2 with SBO.  PMH is significant for Afib on chronic warfarin which was held on admission for possible surgical intervention.  GI function has now improved and pharmacy is consulted to resume Warfarin dosing and bridge with Lovenox.    PTA warfarin 7.5 mg daily.  LD on 11/1.  Admit INR 3.5 - Reversal:  Vit K '10mg'$  IV on  11/2  Today, 07/11/2022: INR 2.2, large increase overnight to therapeutic range CBC:  Hgb stable at 12.4, Plt WNL No bleeding or complications reported Diet: advanced to soft.  SBO resolved, no surgical plans at this time. Drug-drug interactions: Daptomycin may artificially prolong INR; recommend to check INR at trough levels of daptomycin.   Goal of Therapy:  INR 2-3 Monitor platelets by anticoagulation protocol: Yes   Plan:  D/c enoxaparin  Warfarin 5 mg PO x 1  Daily PT/INR. Monitor for signs and symptoms of bleeding.   Gretta Arab PharmD, BCPS [facility] main pharmacy [phone] 07/11/2022 10:23 AM

## 2022-07-12 DIAGNOSIS — K56609 Unspecified intestinal obstruction, unspecified as to partial versus complete obstruction: Secondary | ICD-10-CM | POA: Diagnosis not present

## 2022-07-12 LAB — BASIC METABOLIC PANEL
Anion gap: 6 (ref 5–15)
BUN: 11 mg/dL (ref 8–23)
CO2: 26 mmol/L (ref 22–32)
Calcium: 8.4 mg/dL — ABNORMAL LOW (ref 8.9–10.3)
Chloride: 101 mmol/L (ref 98–111)
Creatinine, Ser: 0.34 mg/dL — ABNORMAL LOW (ref 0.61–1.24)
GFR, Estimated: >60 mL/min (ref >60)
Glucose, Bld: 170 mg/dL — ABNORMAL HIGH (ref 70–99)
Potassium: 3.6 mmol/L (ref 3.5–5.1)
Sodium: 133 mmol/L — ABNORMAL LOW (ref 135–145)

## 2022-07-12 LAB — GLUCOSE, CAPILLARY
Glucose-Capillary: 148 mg/dL — ABNORMAL HIGH (ref 70–99)
Glucose-Capillary: 202 mg/dL — ABNORMAL HIGH (ref 70–99)
Glucose-Capillary: 226 mg/dL — ABNORMAL HIGH (ref 70–99)
Glucose-Capillary: 184 mg/dL — ABNORMAL HIGH (ref 70–99)
Glucose-Capillary: 208 mg/dL — ABNORMAL HIGH (ref 70–99)

## 2022-07-12 LAB — CBC
HCT: 38.7 % — ABNORMAL LOW (ref 39.0–52.0)
Hemoglobin: 12.6 g/dL — ABNORMAL LOW (ref 13.0–17.0)
MCH: 28.9 pg (ref 26.0–34.0)
MCHC: 32.6 g/dL (ref 30.0–36.0)
MCV: 88.8 fL (ref 80.0–100.0)
Platelets: 246 10*3/uL (ref 150–400)
RBC: 4.36 MIL/uL (ref 4.22–5.81)
RDW: 13.7 % (ref 11.5–15.5)
WBC: 10.6 10*3/uL — ABNORMAL HIGH (ref 4.0–10.5)
nRBC: 0 % (ref 0.0–0.2)

## 2022-07-12 LAB — PROTIME-INR
INR: 2.9 — ABNORMAL HIGH (ref 0.8–1.2)
Prothrombin Time: 29.8 seconds — ABNORMAL HIGH (ref 11.4–15.2)

## 2022-07-12 MED ORDER — INSULIN ASPART 100 UNIT/ML IJ SOLN
0.0000 [IU] | Freq: Three times a day (TID) | INTRAMUSCULAR | Status: DC
  Administered 2022-07-12: 3 [IU] via SUBCUTANEOUS
  Administered 2022-07-13: 2 [IU] via SUBCUTANEOUS

## 2022-07-12 MED ORDER — WARFARIN SODIUM 2.5 MG PO TABS
2.5000 mg | ORAL_TABLET | Freq: Once | ORAL | Status: AC
  Administered 2022-07-12: 2.5 mg via ORAL
  Filled 2022-07-12: qty 1

## 2022-07-12 NOTE — Progress Notes (Signed)
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  Patient presented after having abdominal pain and emesis for last 4 days, last appointment 11/1, no fever chills chest pain.  In the ED vitals fairly stable with tachycardia, labs with leukocytosis, metabolic acidosis bicarb 20, total bilirubin 1.5 normal lactic acid INR 3.5. CT abdomen pelvis with contrast: High-grade SBO transition point right upper abdomen with 3 cystic-like collection of bowel loops suspicious for internal hernia, bony destructive finding L1-2 intervertebral disc space substantially widened with some flattening concerning for discitis/osteomyelitis cholelithiasis elevated rectum indicating diarrheal process, prostatomegaly cardiomegaly aortic atherosclerosis scaring of lower lobes partial left colectomy. General surgery consulted and was admitted Managed with IV fluids NG tube decompression and resolved.  Now on oral diet At this time medically stable for discharge, awaiting placement, but is ambulating  Subjective: Seen and examined this morning complains of back pain following ambulation.    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  CK stable, leukocytosis resolved and afebrile.  Some back pain which is  baseline and not worsene> continue on tylenol/tramadol, Lyrica,cymbalta  Chronic A-fib on Coumadin, with supratherapeutic INR: Coumadin reversed due to SBO.  INR therapeutic and off Lovenox bridge continue pharmacy Coumadin dosing Recent Labs  Lab 07/08/22 1207 07/09/22 0415 07/10/22 0337 07/11/22 0256 07/12/22 0255  INR 3.5* 1.5* 1.4* 2.2* 2.9*    Seizure disorder: cont home phenytoin/Lamictal Type 2 diabetes mellitus with peripheral neuropathy: Blood sugar controlled  cont ssi, Lantus 5 units.  At home on 25 units. Recent Labs  Lab 07/11/22 1146 07/11/22 1734 07/11/22 2323 07/12/22 0527 07/12/22 1221  GLUCAP 202* 222* 220* 148* 202*    Hyperlipidemia: Statin is on hold while on daptomycin- cont fenofibrate   DVT prophylaxis: Lovenox/Coumadin Code Status:   Code Status: Full Code Family Communication: plan of care discussed with patient at bedside. Patient status is: Inpatient because of bowel obstruction Level of care: Telemetry   Dispo: The patient is from: SNF            Anticipated disposition: SNF-he wants to go to [facility] for discharge once bed available.  Requested PT evaluation today  Mobility Assessment (last 72 hours)     Mobility Assessment     Row Name 07/12/22 0900 07/11/22 2006 07/11/22 0945 07/10/22 2030 07/10/22 0730   Does patient have an order for bedrest or is patient medically unstable No - Continue assessment No - Continue assessment No - Continue assessment No - Continue assessment No - Continue assessment   What is the highest level of mobility based on the progressive mobility assessment? Level 4 (Walks with assist in room) - Balance while marching in place and cannot step forward and back - Complete Level 4 (Walks with assist in room) - Balance while marching in place and cannot step forward and back - Complete Level 4 (Walks with assist in room) - Balance while marching  in place and cannot step forward and back -  Complete Level 2 (Chairfast) - Balance while sitting on edge of bed and cannot stand Level 2 (Chairfast) - Balance while sitting on edge of bed and cannot stand   Is the above level different from baseline mobility prior to current illness? Yes - Recommend PT order Yes - Recommend PT order Yes - Recommend PT order Yes - Recommend PT order Yes - Recommend PT order    [place] Name 07/09/22 2205           Does patient have an order for bedrest or is patient medically unstable No - Continue assessment       What is the highest level of mobility based on the progressive mobility assessment? Level 2 (Chairfast) - Balance while sitting on edge of bed and cannot stand       Is the above level different from baseline mobility prior to current illness? Yes - Recommend PT order                 Objective: Vitals last 24 hrs: Vitals:   07/11/22 1345 07/11/22 2059 07/12/22 0515 07/12/22 1214  BP: 113/84 124/74 120/85 (!) 123/95  Pulse: 81 84 76 (!) 102  Resp: '18 18 19 16  '$ Temp: (!) 97.5 F (36.4 C) 98.6 F (37 C) 98.7 F (37.1 C) 98.4 F (36.9 C)  TempSrc: Oral Oral Oral   SpO2: 98% 99% 100% 98%  Weight:      Height:       Weight change:   Physical Examination: General exam: AA with extremely pleasant, weak,older appearing HEENT:Oral mucosa moist, Ear/Nose WNL grossly, dentition normal. Respiratory system: bilaterally clear no use of accessory muscle Cardiovascular system: S1 & S2 +, regular rate. Gastrointestinal system: Abdomen soft, NT,ND,BS+ Nervous System:Alert, awake, moving extremities and grossly nonfocal Extremities: LE ankle edema neg, lower extremities warm Skin: No rashes,no icterus. MSK: Normal muscle bulk,tone, power   Medications reviewed:  Scheduled Meds:  Chlorhexidine Gluconate Cloth  6 each Topical Daily   digoxin  0.125 mg Oral Daily   diltiazem  180 mg Oral Daily   DULoxetine  30 mg Oral Daily   feeding supplement  1 Container Oral TID BM   fenofibrate  160  mg Oral Daily   insulin glargine-yfgn  5 Units Subcutaneous QHS   lamoTRIgine  100 mg Oral Daily   melatonin  3 mg Oral QHS   methocarbamol  750 mg Oral QID   pantoprazole  40 mg Oral Daily   potassium chloride  20 mEq Oral Daily   pregabalin  100 mg Oral QHS   sodium chloride flush  10-40 mL Intracatheter Q12H   tamsulosin  0.4 mg Oral Daily   warfarin  2.5 mg Oral ONCE-1600   Warfarin - Pharmacist Dosing Inpatient   Does not apply q1600   Continuous Infusions:  DAPTOmycin (CUBICIN) 500 mg in sodium chloride 0.9 % IVPB 500 mg (07/12/22 0545)      Diet Order             DIET SOFT Room service appropriate? Yes; Fluid consistency: Thin  Diet effective now                  Intake/Output Summary (Last 24 hours) at 07/12/2022 1234 Last data filed at 07/12/2022 1035 Gross per 24 hour  Intake 1149.57 ml  Output 1650 ml  Net -500.43 ml   Net IO Since Admission: -188.86 mL [07/12/22 1234]  Wt Readings  from Last 3 Encounters:  07/08/22 71.9 kg     Unresulted Labs (From admission, onward)     Start     Ordered   07/10/22 3762  Basic metabolic panel  Daily at 5am,   R     Question:  Specimen collection method  Answer:  IV Team=IV Team collect   07/09/22 0755   07/10/22 0500  CBC  Daily at 5am,   R     Question:  Specimen collection method  Answer:  IV Team=IV Team collect   07/09/22 0755   07/09/22 0500  Protime-INR  Daily at 5am,   R      07/08/22 1635   07/09/22 0500  CK  Every Friday,   R      07/08/22 2245          Data Reviewed: I have personally reviewed following labs and imaging studies CBC: Recent Labs  Lab 07/08/22 1207 07/09/22 0415 07/10/22 0337 07/11/22 0256 07/12/22 0255  WBC 13.8* 10.9* 8.9 5.6 10.6*  NEUTROABS 11.0*  --   --   --   --   HGB 15.0 13.2 12.2* 12.4* 12.6*  HCT 47.3 42.2 37.7* 37.7* 38.7*  MCV 90.6 90.6 88.3 88.1 88.8  PLT 364 305 261 237 831   Basic Metabolic Panel: Recent Labs  Lab 07/08/22 1235 07/09/22 0415  07/10/22 0337 07/11/22 0256 07/12/22 0255  NA 137 136 133* 138 133*  K 4.2 3.5 3.1* 3.3* 3.6  CL 99 99 97* 103 101  CO2 20* '24 27 28 26  '$ GLUCOSE 133* 95 138* 125* 170*  BUN 31* 26* '14 10 11  '$ CREATININE 0.84 0.58* 0.58* 0.49* 0.34*  CALCIUM 8.9 8.2* 8.3* 8.5* 8.4*  MG 2.0  --   --   --   --   GFR: Estimated Creatinine Clearance: 86.1 mL/min (A) (by C-G formula based on SCr of 0.34 mg/dL (L)). Liver Function Tests: Recent Labs  Lab 07/08/22 1235 07/09/22 0415  AST 19 17  ALT 17 15  ALKPHOS 74 65  BILITOT 1.5* 1.4*  PROT 6.9 6.0*  ALBUMIN 3.3* 3.0*   Recent Labs  Lab 07/08/22 1235  LIPASE 24  Coagulation Profile: Recent Labs  Lab 07/08/22 1207 07/09/22 0415 07/10/22 0337 07/11/22 0256 07/12/22 0255  INR 3.5* 1.5* 1.4* 2.2* 2.9*  CBG: Recent Labs  Lab 07/11/22 1146 07/11/22 1734 07/11/22 2323 07/12/22 0527 07/12/22 1221  GLUCAP 202* 222* 220* 148* 202*   Recent Labs  Lab 07/08/22 1407 07/08/22 2016  LATICACIDVEN 1.7 1.4    No results found for this or any previous visit (from the past 240 hour(s)).  Antimicrobials: Anti-infectives (From admission, onward)    Start     Dose/Rate Route Frequency Ordered Stop   07/09/22 0900  DAPTOmycin (CUBICIN) 500 mg in sodium chloride 0.9 % IVPB        500 mg 120 mL/hr over 30 Minutes Intravenous Every 24 hours 07/08/22 2245        Culture/Microbiology No results found for: "SDES", "SPECREQUEST", "CULT", "REPTSTATUS"  Other culture-see note  Radiology Studies: No results found.   LOS: 4 days   Antonieta Pert, MD [facility]  07/12/2022, 12:34 PM

## 2022-07-12 NOTE — Care Management Important Message (Signed)
Important Message  Patient Details IM Letter placed in Patients room. Name: Mitchell Bright MRN: [id number] Date of Birth: [birth year]   Medicare Important Message Given:  Yes     Kerin Salen 07/12/2022, 3:20 PM

## 2022-07-12 NOTE — Progress Notes (Signed)
Mobility Specialist - Progress Note   07/12/22 0849  Mobility  Activity Ambulated with assistance in hallway  Level of Assistance Modified independent, requires aide device or extra time  Assistive Device Front wheel walker  Distance Ambulated (ft) 500 ft  Activity Response Tolerated well  Mobility Referral Yes  $Mobility charge 1 Mobility   Pt received in bed and agreed to mobility, no c/o pain nor discomfort during session. Upon standing was somewhat wozzy, quickly cleared. Pt returned to chair with all needs met.   Roderick Pee Mobility Specialist

## 2022-07-12 NOTE — Evaluation (Signed)
Occupational Therapy Evaluation Patient Details Name: Mitchell Bright MRN: [id number] DOB: 1951-07-14 Today's Date: 07/12/2022   History of Present Illness Patient is a 71 year old male who was admitted with small bowel obstruction. PMH: recent laminectomy may 2023,fib, carotid artery dissection, DM, HLD, seizure disorder, hx of TIA, carcinoid tumor with small bowel resection   Clinical Impression   Patient is a 71 year old male who was admitted for above. Patient was living at home with wife independently prior level. Currently, patient is min A for sit to stand with min guard for sitting and LB Dressing at bed level with figure four positioning. Patient is able to carryover back precautions during tasks. Patient reported having been at short term rehab following multiple hospitalizations after back surgery in may. Patient continues to have 15 steps to enter house being large barrier to being able to transition home safely at this time. Patient would continue to benefit from skilled OT services at this time while admitted and after d/c to address noted deficits in order to improve overall safety and independence in ADLs.        Recommendations for follow up therapy are one component of a multi-disciplinary discharge planning process, led by the attending physician.  Recommendations may be updated based on patient status, additional functional criteria and insurance authorization.   Follow Up Recommendations  Skilled nursing-short term rehab (<3 hours/day)    Assistance Recommended at Discharge Frequent or constant Supervision/Assistance  Patient can return home with the following A little help with walking and/or transfers;A little help with bathing/dressing/bathroom;Direct supervision/assist for medications management;Help with stairs or ramp for entrance    Functional Status Assessment  Patient has had a recent decline in their functional status and demonstrates the ability to make  significant improvements in function in a reasonable and predictable amount of time.  Equipment Recommendations  None recommended by OT    Recommendations for Other Services       Precautions / Restrictions Precautions Precautions: Back Restrictions Weight Bearing Restrictions: No      Mobility Bed Mobility Overal bed mobility: Needs Assistance Bed Mobility: Supine to Sit     Supine to sit: Supervision     General bed mobility comments: with use of bed rails and patient able to demonstrate log rolling techninque    Transfers                          Balance Overall balance assessment: Mild deficits observed, not formally tested                                         ADL either performed or assessed with clinical judgement   ADL Overall ADL's : Needs assistance/impaired Eating/Feeding: Sitting;Modified independent   Grooming: Minimal assistance;Standing   Upper Body Bathing: Min guard;Sitting   Lower Body Bathing: Min guard;Bed level Lower Body Bathing Details (indicate cue type and reason): figure four positioning at bed level Upper Body Dressing : Min guard;Sitting   Lower Body Dressing: Minimal assistance;Sit to/from stand Lower Body Dressing Details (indicate cue type and reason): to pull up adult absorbent undergarments in standing with posterior leaning noted onto bed. Toilet Transfer: Minimal Print production planner Details (indicate cue type and reason): patient needed min A for sit to stand with cues to keep rollator close and for proper posture. Toileting- Clothing Manipulation and  Hygiene: Sit to/from stand;Moderate assistance               Vision Patient Visual Report: No change from baseline       Perception     Praxis      Pertinent Vitals/Pain Pain Assessment Pain Assessment: No/denies pain     Hand Dominance     Extremity/Trunk Assessment Upper Extremity Assessment Upper Extremity Assessment:  Overall WFL for tasks assessed   Lower Extremity Assessment Lower Extremity Assessment: Defer to PT evaluation   Cervical / Trunk Assessment Cervical / Trunk Assessment: Kyphotic   Communication     Cognition Arousal/Alertness: Awake/alert Behavior During Therapy: WFL for tasks assessed/performed Overall Cognitive Status: Within Functional Limits for tasks assessed                                       General Comments       Exercises     Shoulder Instructions      Home Living Family/patient expects to be discharged to:: Private residence Living Arrangements: Spouse/significant other Available Help at Discharge: Family;Available 24 hours/day Type of Home: House Home Access: Stairs to enter [employer] of Steps: 15 Entrance Stairs-Rails: Right;Left                 Home Equipment: Rollator (4 wheels)          Prior Functioning/Environment Prior Level of Function : Independent/Modified Independent               ADLs Comments: patient reported being independent prior to back surgery in May        OT Problem List: Impaired balance (sitting and/or standing);Decreased safety awareness;Decreased knowledge of use of DME or AE;Decreased activity tolerance      OT Treatment/Interventions: Self-care/ADL training;Therapeutic exercise;Neuromuscular education;Therapeutic activities;Patient/family education;DME and/or AE instruction;Balance training    OT Goals(Current goals can be found in the care plan section) Acute Rehab OT Goals Patient Stated Goal: to get stronger OT Goal Formulation: With patient Time For Goal Achievement: 07/26/22 Potential to Achieve Goals: Fair  OT Frequency: Min 2X/week    Co-evaluation PT/OT/SLP Co-Evaluation/Treatment: Yes Reason for Co-Treatment: To address functional/ADL transfers PT goals addressed during session: Mobility/safety with mobility OT goals addressed during session: ADL's and self-care       AM-PAC OT "6 Clicks" Daily Activity     Outcome Measure Help from another person eating meals?: None Help from another person taking care of personal grooming?: None Help from another person toileting, which includes using toliet, bedpan, or urinal?: A Lot Help from another person bathing (including washing, rinsing, drying)?: A Lot Help from another person to put on and taking off regular upper body clothing?: A Little Help from another person to put on and taking off regular lower body clothing?: A Lot 6 Click Score: 17   End of Session Equipment Utilized During Treatment: Gait belt;Rolling walker (2 wheels) Nurse Communication: Other (comment) (ok to participate)  Activity Tolerance: Patient tolerated treatment well Patient left: in chair;with call bell/phone within reach  OT Visit Diagnosis: Unsteadiness on feet (R26.81);Other abnormalities of gait and mobility (R26.89)                Time: 5916-3846 OT Time Calculation (min): 30 min Charges:  OT General Charges $OT Visit: 1 Visit OT Evaluation $OT Eval Low Complexity: 1 Low  Peta Peachey OTR/L, MS Acute Rehabilitation Department Office# [phone]  Feliz Beam Treylin Burtch 07/12/2022, 12:43 PM

## 2022-07-12 NOTE — NC FL2 (Signed)
[place] LEVEL OF CARE SCREENING TOOL     IDENTIFICATION  Patient Name: Mitchell Bright Birthdate: Jul 29, 1951 Sex: male Admission Date (Current Location): 07/08/2022  [facility] and [place] Number:  Herbalist and Address:  [facility],  [place] [address], [place]      Provider Number: [id number]  Attending Physician Name and Address:  Antonieta Pert, MD  Relative Name and Phone Number:  Chris Cripps, spouse @ [phone]    Current Level of Care: Hospital Recommended Level of Care: [place] Prior Approval Number:    Date Approved/Denied:   PASRR Number: [id number]  Discharge Plan: SNF    Current Diagnoses: Patient Active Problem List   Diagnosis Date Noted   History of TIA (transient ischemic attack) 07/09/2022   SBO (small bowel obstruction) ([place]) 07/08/2022   Type 2 diabetes mellitus ([place]) 07/08/2022   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[place]) 06/18/2022   Staphylococcus epidermidis bacteremia 06/15/2022   History of lumbar laminectomy for spinal cord decompression 03/25/2022   Degenerative scoliosis 12/31/2021   Hearing loss, bilateral 08/25/2021   ETD (Eustachian tube dysfunction), bilateral 10/29/2019   Rhinitis, chronic 10/29/2019   Diabetic peripheral neuropathy ([place]) 10/22/2015   Nonintractable epilepsy with complex partial seizures ([place]) 10/22/2015   Personal history of other diseases of the digestive system 03/20/2015   Rotator cuff syndrome 04/23/2014   Personal history of transient ischemic attack (TIA), and cerebral infarction without residual deficits 12/12/2012   CHEST PAIN-PRECORDIAL 01/29/2010   HYPERCHOLESTEROLEMIA 08/08/2008   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  PT (By licensed PT), OT (By licensed OT)     PT Frequency: 5x/wk OT Frequency: 5x/wk            Contractures Contractures Info: Not present    Additional Factors Info  Code Status, Allergies Code Status Info: Full Allergies Info: Oseltamivir Phosphate           Current Medications (07/12/2022):  This is the current hospital active medication list Current Facility-Administered Medications  Medication Dose Route Frequency Provider Last Rate Last Admin   acetaminophen (TYLENOL) tablet 650 mg  650 mg Oral Q6H PRN Reubin Milan, MD       Or   acetaminophen (TYLENOL) suppository 650 mg  650 mg Rectal Q6H PRN Reubin Milan, MD       Chlorhexidine Gluconate Cloth 2 % PADS 6 each  6 each Topical Daily Reubin Milan, MD   6 each at 07/11/22 0945   DAPTOmycin (CUBICIN) 500 mg in sodium chloride 0.9 % IVPB  500 mg Intravenous Q24H Lynelle Doctor, RPH 120 mL/hr at 07/12/22 0545 500 mg at 07/12/22 0545   digoxin (LANOXIN) tablet 0.125 mg  0.125 mg Oral Daily Kc, Ramesh, MD   0.125 mg at 07/12/22 0957   diltiazem (CARDIZEM CD) 24 hr capsule 180 mg  180 mg Oral Daily Kc, Ramesh, MD   180 mg at 07/12/22 0956   DULoxetine (CYMBALTA) DR capsule 30 mg  30 mg Oral Daily Kc, Ramesh, MD   30 mg at 07/12/22 0957   feeding supplement (BOOST / RESOURCE BREEZE) liquid 1 Container  1 Container Oral TID BM Antonieta Pert, MD   1 Container at 07/12/22 0955   fenofibrate tablet 160 mg  160 mg Oral Daily Kc, Maren Beach, MD   160 mg at 07/12/22 0957   HYDROmorphone (DILAUDID) injection 0.5 mg  0.5 mg  Intravenous Q3H PRN Reubin Milan, MD   0.5 mg at 07/08/22 2246   insulin glargine-yfgn (SEMGLEE) injection 5 Units  5 Units Subcutaneous QHS Kc, Maren Beach, MD   5 Units at 07/11/22 2125   lamoTRIgine (LAMICTAL) tablet 100 mg  100 mg Oral Daily Kc, Ramesh, MD   100 mg at 07/12/22 0956   melatonin tablet 3 mg  3 mg Oral QHS Kc, Ramesh, MD   3 mg at 07/11/22 2125   methocarbamol (ROBAXIN) tablet 750 mg  750 mg Oral QID Antonieta Pert, MD   750 mg at 07/12/22 0958   ondansetron (ZOFRAN) tablet 4 mg  4 mg Oral Q6H PRN Reubin Milan, MD       Or   ondansetron [facility]) injection 4 mg  4 mg Intravenous Q6H PRN Reubin Milan, MD       pantoprazole (PROTONIX) EC tablet 40 mg  40 mg Oral Daily Randa Spike, RPH   40 mg at 07/12/22 0957   potassium chloride SA (KLOR-CON M) CR tablet 20 mEq  20 mEq Oral Daily Kc, Maren Beach, MD   20 mEq at 07/12/22 0958   pregabalin (LYRICA) capsule 100 mg  100 mg Oral QHS Kc, Ramesh, MD   100 mg at 07/11/22 2125   sodium chloride flush (NS) 0.9 % injection 10-40 mL  10-40 mL Intracatheter Q12H Reubin Milan, MD   10 mL at 07/09/22 0452   sodium chloride flush (NS) 0.9 % injection 10-40 mL  10-40 mL Intracatheter PRN Reubin Milan, MD       tamsulosin [facility]) capsule 0.4 mg  0.4 mg Oral Daily Kc, Ramesh, MD   0.4 mg at 07/12/22 0958   traMADol (ULTRAM) tablet 50 mg  50 mg Oral Q6H PRN Kc, Maren Beach, MD       warfarin (COUMADIN) tablet 2.5 mg  2.5 mg Oral ONCE-1600 Emiliano Dyer, [facility]       Warfarin - Pharmacist Dosing Inpatient   Does not apply q1600 Randa Spike, [facility]         Discharge Medications: Please see discharge summary for a list of discharge medications.  Relevant Imaging Results:  Relevant Lab Results:   Additional Information SS# [id number]; IV abx daptomycin  Cathren Sween, LCSW

## 2022-07-12 NOTE — Progress Notes (Signed)
ANTICOAGULATION CONSULT NOTE   Pharmacy Consult for Warfarin Indication: atrial fibrillation  Allergies  Allergen Reactions   Oseltamivir Phosphate     Patient Measurements: Height: '5\' 10"'$  (177.8 cm) Weight: 71.9 kg (158 lb 8.2 oz) IBW/kg (Calculated) : 73 Heparin Dosing Weight:   Vital Signs: Temp: 98.7 F (37.1 C) (11/06 0515) Temp Source: Oral (11/06 0515) BP: 120/85 (11/06 0515) Pulse Rate: 76 (11/06 0515)  Labs: Recent Labs    07/10/22 0337 07/11/22 0256 07/12/22 0255  HGB 12.2* 12.4* 12.6*  HCT 37.7* 37.7* 38.7*  PLT 261 237 246  LABPROT 17.0* 24.0* 29.8*  INR 1.4* 2.2* 2.9*  CREATININE 0.58* 0.49* 0.34*     Estimated Creatinine Clearance: 86.1 mL/min (A) (by C-G formula based on SCr of 0.34 mg/dL (L)).   Medications:  Scheduled:   Chlorhexidine Gluconate Cloth  6 each Topical Daily   digoxin  0.125 mg Oral Daily   diltiazem  180 mg Oral Daily   DULoxetine  30 mg Oral Daily   feeding supplement  1 Container Oral TID BM   fenofibrate  160 mg Oral Daily   insulin glargine-yfgn  5 Units Subcutaneous QHS   lamoTRIgine  100 mg Oral Daily   melatonin  3 mg Oral QHS   methocarbamol  750 mg Oral QID   pantoprazole  40 mg Oral Daily   potassium chloride  20 mEq Oral Daily   pregabalin  100 mg Oral QHS   sodium chloride flush  10-40 mL Intracatheter Q12H   tamsulosin  0.4 mg Oral Daily   Warfarin - Pharmacist Dosing Inpatient   Does not apply q1600   Infusions:   DAPTOmycin (CUBICIN) 500 mg in sodium chloride 0.9 % IVPB 500 mg (07/12/22 0545)    Assessment: 70 yoM presented to ED on 11/2 with SBO.  PMH is significant for Afib on chronic warfarin which was held on admission for possible surgical intervention.  GI function has now improved and pharmacy is consulted to resume Warfarin dosing and bridge with Lovenox.    PTA warfarin 7.5 mg daily.  LD on 11/1.  Admit INR 3.5 - Reversal:  Vit K '10mg'$  IV on 11/2  Today, 07/12/2022: INR 2.9, therapeutic but  large increase overnight  CBC:  Hgb stable at 12.4, Plt WNL No bleeding or complications reported Diet: advanced to soft.  SBO resolved, no surgical plans at this time. Drug-drug interactions: Daptomycin may artificially prolong INR; therefore checking INR at trough levels of daptomycin.   Goal of Therapy:  INR 2-3 Monitor platelets by anticoagulation protocol: Yes   Plan:  Decrease Warfarin 2.5 mg PO x 1  Daily PT/INR. Monitor for signs and symptoms of bleeding.   Peggyann Juba, PharmD, BCPS [facility] main pharmacy [phone] 07/12/2022 7:21 AM

## 2022-07-12 NOTE — Evaluation (Signed)
Physical Therapy Evaluation Patient Details Name: Mitchell Bright MRN: [id number] DOB: 31-Aug-1951 Today's Date: 07/12/2022  History of Present Illness  Patient is a 71 year old male who was admitted with small bowel obstruction. PMH: recent laminectomy may 2023,fib, carotid artery dissection, DM, HLD, seizure disorder, hx of TIA, carcinoid tumor with small bowel resection   Clinical Impression  Pt admitted with above diagnosis and currently with functional limitations due to the deficits listed below (see PT Problem List). Pt able to verbalized back precautions and maintain throughout. Pt required supervision for bed mobility (performing logroll technique) and min assist for transfers using rollator. Pt ambulated in hallway with RW and min guard +2 for IV pole management only. Pt does have decreased BLE strength with very limited DF strength against resistance, pt reports this is baseline. Pt will benefit from skilled PT to increase their independence and safety with mobility to allow discharge to the venue listed below.         Recommendations for follow up therapy are one component of a multi-disciplinary discharge planning process, led by the attending physician.  Recommendations may be updated based on patient status, additional functional criteria and insurance authorization.  Follow Up Recommendations Skilled nursing-short term rehab (<3 hours/day) Can patient physically be transported by private vehicle: Yes    Assistance Recommended at Discharge Frequent or constant Supervision/Assistance  Patient can return home with the following  A little help with walking and/or transfers;A little help with bathing/dressing/bathroom;Assistance with cooking/housework;Assist for transportation;Help with stairs or ramp for entrance    Equipment Recommendations None recommended by PT  Recommendations for Other Services       Functional Status Assessment Patient has had a recent decline in their  functional status and demonstrates the ability to make significant improvements in function in a reasonable and predictable amount of time.     Precautions / Restrictions Precautions Precautions: Back Precaution Booklet Issued: No Precaution Comments: reviewed BLT orally, logrolling Restrictions Weight Bearing Restrictions: No      Mobility  Bed Mobility Overal bed mobility: Needs Assistance Bed Mobility: Supine to Sit     Supine to sit: Supervision     General bed mobility comments: with use of bed rails and patient able to demonstrate log rolling techninque    Transfers Overall transfer level: Needs assistance Equipment used: Rollator (4 wheels) Transfers: Sit to/from Stand Sit to Stand: Min assist           General transfer comment: Pt required min assist for lift assist from bed, increased time, powering up from bed using BUE.    Ambulation/Gait Ambulation/Gait assistance: Min guard, +2 safety/equipment Gait Distance (Feet): 400 Feet Assistive device: Rollator (4 wheels) Gait Pattern/deviations: Step-to pattern, Decreased dorsiflexion - left, Decreased weight shift to left Gait velocity: decreased     General Gait Details: Pt ambulated with rollator and min guard, +2 for IV pole management only. Pt without LOB or physical assist required. Pt demonstrated L foot external rotation which might compensate for decreased DF on L foot. Multiple VCs for proxmity to device and standing up tall.  Stairs            Wheelchair Mobility    Modified Rankin (Stroke Patients Only)       Balance Overall balance assessment: Needs assistance Sitting-balance support: Feet supported, No upper extremity supported Sitting balance-Leahy Scale: Good     Standing balance support: Reliant on assistive device for balance, During functional activity, Bilateral upper extremity supported Standing balance-Leahy Scale:  Poor                                Pertinent Vitals/Pain Pain Assessment Pain Assessment: No/denies pain    Home Living Family/patient expects to be discharged to:: Private residence Living Arrangements: Spouse/significant other Available Help at Discharge: Family;Available 24 hours/day Type of Home: House Home Access: Stairs to enter Entrance Stairs-Rails: Psychiatric nurse of Steps: 15     Home Equipment: Rollator (4 wheels)      Prior Function Prior Level of Function : Independent/Modified Independent             Mobility Comments: rollator at baseline, one on each level of house ADLs Comments: patient reported being independent prior to back surgery in May     Hand Dominance        Extremity/Trunk Assessment   Upper Extremity Assessment Upper Extremity Assessment: Defer to OT evaluation    Lower Extremity Assessment Lower Extremity Assessment: RLE deficits/detail;LLE deficits/detail RLE Deficits / Details: MMT hip/knee/ankle grossly 4+/5, ROM functional throughout RLE Sensation: WNL LLE Deficits / Details: MMT hip/knee grossly 4+/5, ankle 3/5 (breaks immediately upon meeting resistance, ROM especially reduced into DF LLE Sensation: WNL    Cervical / Trunk Assessment Cervical / Trunk Assessment: Kyphotic  Communication      Cognition Arousal/Alertness: Awake/alert Behavior During Therapy: WFL for tasks assessed/performed Overall Cognitive Status: Within Functional Limits for tasks assessed                                 General Comments: Very pleasant, retired Oceanographer Comments      Exercises     Assessment/Plan    PT Assessment Patient needs continued PT services  PT Problem List Decreased strength;Decreased range of motion;Decreased activity tolerance;Decreased balance;Decreased mobility;Decreased coordination;Pain       PT Treatment Interventions DME instruction;Gait training;Stair training;Functional mobility  training;Therapeutic activities;Therapeutic exercise;Balance training;Neuromuscular re-education;Patient/family education    PT Goals (Current goals can be found in the Care Plan section)  Acute Rehab PT Goals Patient Stated Goal: To be able to go home safely with improved strength PT Goal Formulation: With patient Time For Goal Achievement: 07/26/22 Potential to Achieve Goals: Good    Frequency Min 2X/week     Co-evaluation PT/OT/SLP Co-Evaluation/Treatment: Yes Reason for Co-Treatment: To address functional/ADL transfers PT goals addressed during session: Mobility/safety with mobility OT goals addressed during session: ADL's and self-care       AM-PAC PT "6 Clicks" Mobility  Outcome Measure Help needed turning from your back to your side while in a flat bed without using bedrails?: A Little Help needed moving from lying on your back to sitting on the side of a flat bed without using bedrails?: A Little Help needed moving to and from a bed to a chair (including a wheelchair)?: A Little Help needed standing up from a chair using your arms (e.g., wheelchair or bedside chair)?: A Little Help needed to walk in hospital room?: A Little Help needed climbing 3-5 steps with a railing? : A Lot 6 Click Score: 17    End of Session Equipment Utilized During Treatment: Gait belt Activity Tolerance: Patient tolerated treatment well;No increased pain Patient left: in chair;with call bell/phone within reach Nurse Communication: Mobility status PT Visit Diagnosis: Difficulty in walking, not elsewhere classified (R26.2);Muscle weakness (generalized) (M62.81)  Time: 2993-7169 PT Time Calculation (min) (ACUTE ONLY): 22 min   Charges:   PT Evaluation $PT Eval Low Complexity: 1 Low          Coolidge Breeze, PT, DPT [facility] Rehabilitation Department Office: [phone] Weekend pager: [phone]  Coolidge Breeze 07/12/2022, 1:07 PM

## 2022-07-12 NOTE — TOC Progression Note (Signed)
Transition of Care [facility]) - Progression Note    Patient Details  Name: Mitchell Bright MRN: [id number] Date of Birth: 09-Aug-1951  [facility] CM/SW Contact  Lennart Pall, LCSW Phone Number: 07/12/2022, 4:44 PM  Clinical Narrative:    Met with pt and have spoken with wife and both confirming pt will plan to return to [facility] SNF to complete his rehab since they were unable to secure bed at another facility.  Facility ready to admit pt back tomorrow.  MD aware.   Expected Discharge Plan: [place] Barriers to Discharge: Continued Medical Work up, SNF Pending bed offer  Expected Discharge Plan and Services Expected Discharge Plan: [place] In-house Referral: Clinical Social Work   Post Acute Care Choice: [place] Living arrangements for the past 2 months: Single Family Home                 DME Arranged: N/A DME Agency: NA                   Social Determinants of Health (SDOH) Interventions    Readmission Risk Interventions    07/09/2022    2:47 PM  Readmission Risk Prevention Plan  Transportation Screening Complete  PCP or Specialist Appt within 5-7 Days Complete  Home Care Screening Complete  Medication Review (RN CM) Complete

## 2022-07-13 DIAGNOSIS — K56609 Unspecified intestinal obstruction, unspecified as to partial versus complete obstruction: Secondary | ICD-10-CM | POA: Diagnosis not present

## 2022-07-13 LAB — BASIC METABOLIC PANEL
Anion gap: 5 (ref 5–15)
BUN: 11 mg/dL (ref 8–23)
CO2: 26 mmol/L (ref 22–32)
Calcium: 8.5 mg/dL — ABNORMAL LOW (ref 8.9–10.3)
Chloride: 103 mmol/L (ref 98–111)
Creatinine, Ser: 0.52 mg/dL — ABNORMAL LOW (ref 0.61–1.24)
GFR, Estimated: >60 mL/min (ref >60)
Glucose, Bld: 207 mg/dL — ABNORMAL HIGH (ref 70–99)
Potassium: 3.8 mmol/L (ref 3.5–5.1)
Sodium: 134 mmol/L — ABNORMAL LOW (ref 135–145)

## 2022-07-13 LAB — CBC
HCT: 38.4 % — ABNORMAL LOW (ref 39.0–52.0)
Hemoglobin: 12.5 g/dL — ABNORMAL LOW (ref 13.0–17.0)
MCH: 28.7 pg (ref 26.0–34.0)
MCHC: 32.6 g/dL (ref 30.0–36.0)
MCV: 88.3 fL (ref 80.0–100.0)
Platelets: 230 10*3/uL (ref 150–400)
RBC: 4.35 MIL/uL (ref 4.22–5.81)
RDW: 13.8 % (ref 11.5–15.5)
WBC: 9 10*3/uL (ref 4.0–10.5)
nRBC: 0 % (ref 0.0–0.2)

## 2022-07-13 LAB — GLUCOSE, CAPILLARY: Glucose-Capillary: 175 mg/dL — ABNORMAL HIGH (ref 70–99)

## 2022-07-13 LAB — PROTIME-INR
INR: 2.6 — ABNORMAL HIGH (ref 0.8–1.2)
Prothrombin Time: 27.3 seconds — ABNORMAL HIGH (ref 11.4–15.2)

## 2022-07-13 MED ORDER — PANTOPRAZOLE SODIUM 40 MG PO TBEC: 40.0000 mg | DELAYED_RELEASE_TABLET | Freq: Every day | ORAL | 0 refills | Status: DC

## 2022-07-13 MED ORDER — DAPTOMYCIN IV (FOR PTA / DISCHARGE USE ONLY): 500.0000 mg | INTRAVENOUS | 0 refills | Status: AC

## 2022-07-13 MED ORDER — WARFARIN SODIUM 5 MG PO TABS: ORAL_TABLET | ORAL | 0 refills | Status: DC

## 2022-07-13 MED ORDER — HEPARIN SOD (PORK) LOCK FLUSH 100 UNIT/ML IV SOLN
250.0000 [IU] | INTRAVENOUS | Status: AC | PRN
  Administered 2022-07-13: 250 [IU]

## 2022-07-13 MED ORDER — INSULIN GLARGINE 100 UNIT/ML ~~LOC~~ SOLN: 10.0000 [IU] | Freq: Every day | SUBCUTANEOUS | 11 refills | Status: DC

## 2022-07-13 MED ORDER — WARFARIN SODIUM 2.5 MG PO TABS: 5.0000 mg | ORAL_TABLET | Freq: Once | ORAL | Status: DC

## 2022-07-13 NOTE — Progress Notes (Signed)
ANTICOAGULATION CONSULT NOTE   Pharmacy Consult for Warfarin Indication: atrial fibrillation  Allergies  Allergen Reactions   Oseltamivir Phosphate     Patient Measurements: Height: '5\' 10"'$  (177.8 cm) Weight: 71.9 kg (158 lb 8.2 oz) IBW/kg (Calculated) : 73 Heparin Dosing Weight:   Vital Signs: Temp: 97.7 F (36.5 C) (11/07 0635) Temp Source: Oral (11/07 0635) BP: 135/78 (11/07 0635) Pulse Rate: 68 (11/07 0635)  Labs: Recent Labs    07/11/22 0256 07/12/22 0255 07/13/22 0405  HGB 12.4* 12.6* 12.5*  HCT 37.7* 38.7* 38.4*  PLT 237 246 230  LABPROT 24.0* 29.8* 27.3*  INR 2.2* 2.9* 2.6*  CREATININE 0.49* 0.34* 0.52*     Estimated Creatinine Clearance: 86.1 mL/min (A) (by C-G formula based on SCr of 0.52 mg/dL (L)).   Medications:  Scheduled:   Chlorhexidine Gluconate Cloth  6 each Topical Daily   digoxin  0.125 mg Oral Daily   diltiazem  180 mg Oral Daily   DULoxetine  30 mg Oral Daily   feeding supplement  1 Container Oral TID BM   fenofibrate  160 mg Oral Daily   insulin aspart  0-9 Units Subcutaneous TID WC   insulin glargine-yfgn  5 Units Subcutaneous QHS   lamoTRIgine  100 mg Oral Daily   melatonin  3 mg Oral QHS   methocarbamol  750 mg Oral QID   pantoprazole  40 mg Oral Daily   potassium chloride  20 mEq Oral Daily   pregabalin  100 mg Oral QHS   sodium chloride flush  10-40 mL Intracatheter Q12H   tamsulosin  0.4 mg Oral Daily   Warfarin - Pharmacist Dosing Inpatient   Does not apply q1600   Infusions:   DAPTOmycin (CUBICIN) 500 mg in sodium chloride 0.9 % IVPB 500 mg (07/13/22 0540)    Assessment: 65 yoM presented to ED on 11/2 with SBO.  PMH is significant for Afib on chronic warfarin which was held on admission for possible surgical intervention.  GI function has now improved and pharmacy is consulted to resume Warfarin dosing and bridge with Lovenox.    PTA warfarin 7.5 mg daily.  LD on 11/1.  Admit INR 3.5 - Reversal:  Vit K '10mg'$  IV on  11/2  Today, 07/13/2022: INR 2.6, therapeutic   CBC stable No bleeding or complications reported Diet: advanced to soft.  SBO resolved, no surgical plans at this time. Drug-drug interactions: Daptomycin may artificially prolong INR; therefore checking INR at trough levels of daptomycin.   Goal of Therapy:  INR 2-3   Plan:  Warfarin 5 mg PO x 1  Daily PT/INR. Monitor for signs and symptoms of bleeding.   Peggyann Juba, PharmD, BCPS [facility] main pharmacy [phone] 07/13/2022 7:00 AM

## 2022-07-13 NOTE — Progress Notes (Signed)
Report given to [facility] [phone]) and questions answered.

## 2022-07-13 NOTE — Discharge Summary (Signed)
Physician Discharge Summary  Mitchell Bright BJS:[id number] DOB: 04-Sep-1951 DOA: 07/08/2022  PCP: Ivan Anchors, MD  Admit date: 07/08/2022 Discharge date: 07/13/2022 Recommendations for Outpatient Follow-up:  Follow up with PCP in 1 weeks-call for appointment Please obtain CMP/CBC /CK in one week Continue daptomycin as planned previously through July 27, 2022  Discharge Dispo: SNF Discharge Condition: Stable Code Status:   Code Status: Full Code Diet recommendation:  Diet Order             DIET SOFT Room service appropriate? Yes; Fluid consistency: Thin  Diet effective now                    Brief/Interim Summary:  71 year old male with chronic A-fib history of carotid artery dissection type 2 diabetes hyperlipidemia seizure disorder TIA GI bleed carcinoid tumor with history of small bowel resection/sigmoidectomy recent laminectomy UTI Staph epidermidis bacteremia and osteomyelitis of the spine currently on daptomycin, admitted with working diagnosis of small bowel obstruction.  Patient presented after having abdominal pain and emesis for last 4 days, last appointment 11/1, no fever chills chest pain.  In the ED vitals fairly stable with tachycardia, labs with leukocytosis, metabolic acidosis bicarb 20, total bilirubin 1.5 normal lactic acid INR 3.5. CT abdomen pelvis with contrast: High-grade SBO transition point right upper abdomen with 3 cystic-like collection of bowel loops suspicious for internal hernia, bony destructive finding L1-2 intervertebral disc space substantially widened with some flattening concerning for discitis/osteomyelitis cholelithiasis elevated rectum indicating diarrheal process, prostatomegaly cardiomegaly aortic atherosclerosis scaring of lower lobes partial left colectomy. General surgery consulted and was admitted Managed with IV fluids NG tube decompression and resolved.  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  CK stable, leukocytosis resolved and afebrile.  Some back pain which is baseline and not worsene> continue on tylenol/tramadol, Lyrica,cymbalta " recently seen at [facility] and diagnosed with MRSE bacteremia/spine osteo (no hardware) - BCx cleared on 10/10, TTE/TEE neg, seen by ID there with plans to continue with Daptomycin thru 07/27/22 ( 6 weeks from neg BCx)"  Chronic A-fib on Coumadin, with supratherapeutic INR: Coumadin reversed due to SBO.  INR therapeutic and off Lovenox bridge.  Continue Coumadin at SNF with monitoring of INR per pharmacy Recent Labs  Lab 07/09/22 0415 07/10/22 0337 07/11/22 0256 07/12/22 0255 07/13/22 0405  INR 1.5* 1.4* 2.2* 2.9* 2.6*    Seizure disorder: cont home phenytoin/Lamictal Type 2 diabetes mellitus with peripheral neuropathy: Blood sugar controlled  cont ssi, cont lantus at 7 u> PTA on 25 units> can increase as needed at rehab. Recent Labs  Lab 07/12/22 1221 07/12/22 1626 07/12/22 2158 07/12/22 2327 07/13/22 0733  GLUCAP 202* 226* 184* 208* 175*    Hyperlipidemia: Statin is on hold while on daptomycin- cont fenofibrate   Consults: none Subjective: Alert awake resting  comfortably afebrile, eager to go to rehab this morning  Discharge  Exam: Vitals:   07/12/22 2044 07/13/22 0635  BP: 125/88 135/78  Pulse: 89 68  Resp: 18 16  Temp: 98.1 F (36.7 C) 97.7 F (36.5 C)  SpO2: 99% 98%   General: Pt is alert, awake, not in acute distress Cardiovascular: RRR, S1/S2 +, no rubs, no gallops Respiratory: CTA bilaterally, no wheezing, no rhonchi Abdominal: Soft, NT, ND, bowel sounds + Extremities: no edema, no cyanosis  Discharge Instructions  Discharge Instructions     [facility] pharmacist to adjust dose for Vancomycin, Aminoglycosides and other anti-infective therapies as requested by physician.   Complete by: As directed    [facility] to provide Cath Flo 44m   Complete by: As directed    Administer for PICC line occlusion and as ordered by physician for other access device issues.   Anaphylaxis Kit: Provided to treat any anaphylactic reaction to the medication being provided to the patient if First Dose or when requested by physician   Complete by: As directed    Epinephrine 156mml vial / amp: Administer 0.15m26m0.15ml315mubcutaneously once for moderate to severe anaphylaxis, nurse to call physician and pharmacy when reaction occurs and call 911 if needed for immediate care   Diphenhydramine 50mg44mIV vial: Administer 25-50mg 12mM PRN for first dose reaction, rash, itching, mild reaction, nurse to call physician and pharmacy when reaction occurs   Sodium Chloride 0.9% NS 500ml I88mdminister if needed for hypovolemic blood pressure drop or as ordered by physician after call to physician with anaphylactic reaction   Change dressing on IV access line weekly and PRN   Complete by: As directed    Discharge instructions   Complete by: As directed    Hold her Lipitor until you are done with daptomycin as it may increase risk of rhabdomyolysis  Follow-up weekly CBC CMP CK while on daptomycin-follow antibiotic discharge instruction  Please call call MD or return to ER for similar or worsening recurring  problem that brought you to hospital or if any fever,nausea/vomiting,abdominal pain, uncontrolled pain, chest pain,  shortness of breath or any other alarming symptoms.  Please follow-up your doctor as instructed in a week time and call the office for appointment.  Please avoid alcohol, smoking, or any other illicit substance and maintain healthy habits including taking your regular medications as prescribed.  You were cared for by a hospitalist during your hospital stay. If you have any questions about your discharge medications or the care you received while you were in the hospital after you are discharged, you can call the unit and ask to speak with the hospitalist on call if the hospitalist that took care of you is not available.  Once you are discharged, your primary care physician will handle any further medical issues. Please note that NO REFILLS for any discharge medications will be authorized once you are discharged, as it is imperative that you return to your primary care physician (or establish a relationship with a primary care physician if you do not have one) for your aftercare needs so that they can reassess your need for medications and monitor your lab values   Flush IV access with Sodium Chloride 0.9% and Heparin 10 units/ml or 100 units/ml   Complete by: As directed    Home infusion instructions - [facility]   Complete by: As directed    Instructions: Flush IV access with Sodium Chloride 0.9% and Heparin 10units/ml  or 100units/ml   Change dressing on IV access line: Weekly and PRN   Instructions Cath Flo 68m: Administer for PICC Line occlusion and as ordered by physician for other access device   [facility] pharmacist to adjust dose for: Vancomycin, Aminoglycosides and other anti-infective therapies as requested by physician   Increase activity slowly   Complete by: As directed    Method of administration may be changed at the discretion of home infusion  pharmacist based upon assessment of the patient and/or caregiver's ability to self-administer the medication ordered   Complete by: As directed       Allergies as of 07/13/2022       Reactions   Oseltamivir Phosphate         Medication List     STOP taking these medications    atorvastatin 40 MG tablet Commonly known as: LIPITOR   traMADol 50 MG tablet Commonly known as: ULTRAM       TAKE these medications    acetaminophen 325 MG tablet Commonly known as: TYLENOL Take 650 mg by mouth every 6 (six) hours as needed for mild pain.   daptomycin  IVPB Commonly known as: CUBICIN Inject 500 mg into the vein daily for 18 days. Indication:  MRSE spine osteo First Dose: Yes Last Day of Therapy:  07/27/22 Labs - Once weekly:  CBC/D, BMP, and CPK Labs - Every other week:  ESR and CRP Method of administration: IV Push Method of administration may be changed at the discretion of home infusion pharmacist based upon assessment of the patient and/or caregiver's ability to self-administer the medication ordered. What changed: additional instructions   digoxin 0.125 MG tablet Commonly known as: LANOXIN Take 0.125 mg by mouth daily.   diltiazem 180 MG 24 hr capsule Commonly known as: DILACOR XR Take 180 mg by mouth daily.   DULoxetine 30 MG capsule Commonly known as: CYMBALTA Take 30 mg by mouth daily.   fenofibrate 160 MG tablet Take 160 mg by mouth daily.   insulin glargine 100 UNIT/ML injection Commonly known as: LANTUS Inject 0.1 mLs (10 Units total) into the skin at bedtime. What changed: how much to take   lamoTRIgine 100 MG tablet Commonly known as: LAMICTAL Take 100 mg by mouth daily.   lidocaine 4 % Place 1 patch onto the skin daily.   melatonin 3 MG Tabs tablet Take 3 mg by mouth at bedtime.   melatonin 1 MG Tabs tablet Take 1 mg by mouth at bedtime.   methocarbamol 500 MG tablet Commonly known as: ROBAXIN Take 750 mg by mouth 4 (four) times daily.  For one month with last day on 07/26/22   ondansetron 4 MG tablet Commonly known as: ZOFRAN Take 4 mg by mouth every 8 (eight) hours as needed for nausea or vomiting.   pantoprazole 40 MG tablet Commonly known as: PROTONIX Take 1 tablet (40 mg total) by mouth daily.   Phenergan 25 MG/ML injection Generic drug: promethazine Inject 12.5 mg into the muscle every 6 (six) hours as needed for nausea or vomiting.   polyethylene glycol 17 g packet Commonly known as: MIRALAX / GLYCOLAX Take 17 g by mouth daily.   pregabalin 100 MG capsule Commonly known as: LYRICA Take 100 mg by mouth at bedtime.   senna 8.6 MG Tabs tablet Commonly known as: SENOKOT Take 2 tablets by mouth daily as needed for mild constipation.   simethicone 125 MG chewable tablet Commonly known as: MYLICON Chew 1384mg by mouth  4 (four) times daily - after meals and at bedtime.   sodium phosphate Pediatric 3.5-9.5 GM/59ML enema Place 1 enema rectally daily as needed for severe constipation.   Synjardy XR 12.01-999 MG Tb24 Generic drug: Empagliflozin-metFORMIN HCl ER Take 1 tablet by mouth 2 (two) times daily with a meal.   tamsulosin 0.4 MG Caps capsule Commonly known as: FLOMAX Take 0.4 mg by mouth daily.   TRUEplus Glucose 15 GM/32ML Gel Generic drug: Glucose Take 32 mLs by mouth as needed (for hypoglycemia).   warfarin 5 MG tablet Commonly known as: COUMADIN Adjustment of Coumadin dose to maintain INR 2-3 What changed:  medication strength how much to take how to take this when to take this additional instructions               Discharge Care Instructions  (From admission, onward)           Start     Ordered   07/13/22 0000  Change dressing on IV access line weekly and PRN  (Home infusion instructions - [facility] )        07/13/22 0853            Follow-up Information     Ivan Anchors, MD Follow up in 1 week(s).   Specialty: Family Medicine Contact  information: [place] [address] [phone]                Allergies  Allergen Reactions   Oseltamivir Phosphate     The results of significant diagnostics from this hospitalization (including imaging, microbiology, ancillary and laboratory) are listed below for reference.    Microbiology: No results found for this or any previous visit (from the past 240 hour(s)).  Procedures/Studies: DG Abd Portable 1V-Small Bowel Obstruction Protocol-initial, 8 hr delay  Result Date: 07/09/2022 CLINICAL DATA:  Small-bowel obstruction EXAM: PORTABLE ABDOMEN - 1 VIEW COMPARISON:  CT 07/08/2022 FINDINGS: Contrast opacifies numerous dilated loops of small bowel within the mid abdomen. However, oral contrast has now passed into the ascending colon and together the findings suggest the presence of a distal partial or resolving small bowel obstruction. Contrast opacifies the bladder lumen. No free intraperitoneal gas. IMPRESSION: 1. Findings suggestive of a distal partial or resolving small bowel obstruction with contrast now opacifying the ascending colon. Electronically Signed   By: Fidela Salisbury M.D.   On: 07/09/2022 02:38   DG Abd Portable 1V-Small Bowel Protocol-Position Verification  Result Date: 07/08/2022 CLINICAL DATA:  Enteric catheter placement EXAM: PORTABLE ABDOMEN - 1 VIEW COMPARISON:  07/08/2022 FINDINGS: Single frontal view of the chest demonstrates an unremarkable cardiac silhouette. Left-sided PICC tip overlies the superior vena cava. Enteric catheter passes below diaphragm tip and side port projecting over gastric fundus. No airspace disease, effusion, or pneumothorax. No acute bony abnormality. IMPRESSION: 1. Enteric catheter tip projecting over gastric fundus. 2. No acute intrathoracic process. Electronically Signed   By: Randa Ngo M.D.   On: 07/08/2022 17:17   CT ABDOMEN PELVIS W CONTRAST  Result Date: 07/08/2022 CLINICAL DATA:  Mid abdominal pain for  about 1 month. Reportedly gallstones were found on an outside ultrasound this morning. EXAM: CT ABDOMEN AND PELVIS WITH CONTRAST TECHNIQUE: Multidetector CT imaging of the abdomen and pelvis was performed using the standard protocol following bolus administration of intravenous contrast. RADIATION DOSE REDUCTION: This exam was performed according to the departmental dose-optimization program which includes automated exposure control, adjustment of the mA and/or kV according to patient size  and/or use of iterative reconstruction technique. CONTRAST:  148m OMNIPAQUE IOHEXOL 300 MG/ML  SOLN COMPARISON:  CT scan 05/20/2006 FINDINGS: Lower chest: Bandlike atelectasis or scarring in both lower lobes. Borderline cardiomegaly. Right coronary artery atherosclerotic calcification. Hepatobiliary: 1.4 cm gallstone in the gallbladder, image 37 series 2. No gallbladder wall thickening or pericholecystic fluid. Hepatic parenchyma unremarkable. No biliary dilatation. Pancreas: Unremarkable Spleen: Unremarkable Adrenals/Urinary Tract: Prominent median lobe of prostate gland indents the bladder base. The kidneys and adrenal glands appeared unremarkable. Stomach/Bowel: Small bowel obstruction with dilated loops of small bowel extending to a sac-like collection of bowel loops in the right upper abdomen suspicious for internal hernia, transition point shown on images 51 through 57 of series 5 where the dilated loop approaches the sac of nondilated loops. Partial left colectomy and also evidence of prior small bowel surgery on the right. The dilated loops currently measure up to 4.6 cm. No findings of pneumatosis or extraluminal gas. No definite hypoenhancing bowel segments. There is also an air-level in the rectum which may indicate diarrheal process. Vascular/Lymphatic: Atherosclerosis is present, including aortoiliac atherosclerotic disease. Reproductive: Prostatomegaly indenting the bladder base. Other: Trace ascites in the vicinity  of the left inguinal ring, image 72 series 2. Musculoskeletal: Levoconvex lumbar scoliosis. Irregular bony destructive type findings along the L1-2 intervertebral disc space which is substantially widened with some flattening and reduced height especially of L1 and to a lesser degree of L2, appearance concerning for possible discitis-osteomyelitis. Degenerative findings at the remaining lumbar levels. Suspected right foraminal impingement at L1-2, L2-3, L3-4, L5-S1; and left foraminal impingement at L4-5 at L5-S1. IMPRESSION: 1. High-grade small bowel obstruction with transition point in the right upper abdomen where there is a sac-like collection of bowel loops suspicious for internal hernia. No pneumatosis or extraluminal gas. 2. Abnormal bony destructive findings along the L1-2 intervertebral disc space which is substantially widened with some flattening and reduced height of L1 and to a lesser degree of L2, appearance concerning for discitis-osteomyelitis. Lumbar MRI could be utilized for further characterization. 3. Air-level in the rectum which may indicate diarrheal process. 4. Cholelithiasis. 5. Prostatomegaly indenting the bladder base. 6. Multilevel impingement in the lumbar spine. 7. Borderline cardiomegaly with right coronary artery atherosclerotic calcification. 8. Trace ascites near the left inguinal ring. 9. Aortic atherosclerosis. 10. Bandlike atelectasis or scarring in both lower lobes. 11. Partial left colectomy and also evidence of prior small bowel surgery on the right. Aortic Atherosclerosis (ICD10-I70.0). Electronically Signed   By: WVan ClinesM.D.   On: 07/08/2022 14:03    Labs: BNP (last 3 results) No results for input(s): "BNP" in the last 8760 hours. Basic Metabolic Panel: Recent Labs  Lab 07/08/22 1235 07/09/22 0415 07/10/22 0337 07/11/22 0256 07/12/22 0255 07/13/22 0405  NA 137 136 133* 138 133* 134*  K 4.2 3.5 3.1* 3.3* 3.6 3.8  CL 99 99 97* 103 101 103  CO2 20*  _0 GLUCOSE 133* 95 138* 125* 170* 207*  BUN 31* 26* _1 CREATININE 0.84 0.58* 0.58* 0.49* 0.34* 0.52*  CALCIUM 8.9 8.2* 8.3* 8.5* 8.4* 8.5*  MG 2.0  --   --   --   --   --    Liver Function Tests: Recent Labs  Lab 07/08/22 1235 07/09/22 0415  AST 19 17  ALT 17 15  ALKPHOS 74 65  BILITOT 1.5* 1.4*  PROT 6.9 6.0*  ALBUMIN 3.3* 3.0*   Recent Labs  Lab 07/08/22 1235  LIPASE 24   No results for input(s): "AMMONIA" in the last 168 hours. CBC: Recent Labs  Lab 07/08/22 1207 07/09/22 0415 07/10/22 0337 07/11/22 0256 07/12/22 0255 07/13/22 0405  WBC 13.8* 10.9* 8.9 5.6 10.6* 9.0  NEUTROABS 11.0*  --   --   --   --   --   HGB 15.0 13.2 12.2* 12.4* 12.6* 12.5*  HCT 47.3 42.2 37.7* 37.7* 38.7* 38.4*  MCV 90.6 90.6 88.3 88.1 88.8 88.3  PLT 364 305 261 237 246 230   Cardiac Enzymes: Recent Labs  Lab 07/09/22 0415  CKTOTAL 41*   BNP: Invalid input(s): "POCBNP" CBG: Recent Labs  Lab 07/12/22 1221 07/12/22 1626 07/12/22 2158 07/12/22 2327 07/13/22 0733  GLUCAP 202* 226* 184* 208* 175*   Recent Labs  Lab 07/10/22 0337 07/11/22 0256 07/12/22 0255 07/13/22 0405  WBC 8.9 5.6 10.6* 9.0   Microbiology No results found for this or any previous visit (from the past 240 hour(s)).  Time coordinating discharge: 25 minutes  SIGNED: Antonieta Pert, MD  [facility] 07/13/2022, 8:53 AM  If 7PM-7AM, please contact night-coverage [email or web address]

## 2022-07-13 NOTE — TOC Transition Note (Signed)
Transition of Care [facility]) - CM/SW Discharge Note   Patient Details  Name: Mitchell Bright MRN: [id number] Date of Birth: August 01, 1951  [facility] of [facility] CM/SW Contact:  Lennart Pall, LCSW Phone Number: 07/13/2022, 9:59 AM   Clinical Narrative:    Pt cleared for dc today to [facility].  Daughter, Barnetta Chapel, to provide dc transportation.  RN to call report to [phone].  No further TOC needs.   Final next level of care: [place] Barriers to Discharge: Barriers Resolved   Patient Goals and CMS Choice Patient states their goals for this hospitalization and ongoing recovery are:: hopes to dc to [facility] if possible      Discharge Placement              Patient chooses bed at: [facility] Patient to be transferred to facility by: family to transport Name of family member notified: wife and daughter    Discharge Plan and Services In-house Referral: Clinical Social Work   Post Acute Care Choice: [place]          DME Arranged: N/A DME Agency: NA                  Social Determinants of Health ([place]) Interventions     Readmission Risk Interventions    07/09/2022    2:47 PM  Readmission Risk Prevention Plan  Transportation Screening Complete  PCP or Specialist Appt within 5-7 Days Complete  Home Care Screening Complete  Medication Review (RN CM) Complete

## 2022-07-25 ENCOUNTER — Emergency Department (HOSPITAL_COMMUNITY): Payer: Medicare Other

## 2022-07-25 ENCOUNTER — Other Ambulatory Visit: Payer: Self-pay

## 2022-07-25 ENCOUNTER — Emergency Department (HOSPITAL_COMMUNITY)
Admission: EM | Admit: 2022-07-25 | Discharge: 2022-07-25 | Disposition: A | Discharge status: Home / Self Care | Payer: Medicare Other | Attending: Emergency Medicine | Admitting: Emergency Medicine

## 2022-07-25 ENCOUNTER — Encounter (HOSPITAL_COMMUNITY): Payer: Self-pay | Admitting: Emergency Medicine

## 2022-07-25 DIAGNOSIS — N3001 Acute cystitis with hematuria: Secondary | ICD-10-CM | POA: Diagnosis not present

## 2022-07-25 DIAGNOSIS — R339 Retention of urine, unspecified: Secondary | ICD-10-CM | POA: Diagnosis not present

## 2022-07-25 DIAGNOSIS — R630 Anorexia: Secondary | ICD-10-CM | POA: Diagnosis not present

## 2022-07-25 DIAGNOSIS — Z794 Long term (current) use of insulin: Secondary | ICD-10-CM | POA: Diagnosis not present

## 2022-07-25 DIAGNOSIS — R112 Nausea with vomiting, unspecified: Secondary | ICD-10-CM | POA: Diagnosis present

## 2022-07-25 DIAGNOSIS — I4891 Unspecified atrial fibrillation: Secondary | ICD-10-CM | POA: Diagnosis not present

## 2022-07-25 DIAGNOSIS — Z7901 Long term (current) use of anticoagulants: Secondary | ICD-10-CM | POA: Insufficient documentation

## 2022-07-25 DIAGNOSIS — R338 Other retention of urine: Secondary | ICD-10-CM

## 2022-07-25 LAB — COMPREHENSIVE METABOLIC PANEL
ALT: 13 U/L (ref 0–44)
AST: 16 U/L (ref 15–41)
Albumin: 3.5 g/dL (ref 3.5–5.0)
Alkaline Phosphatase: 68 U/L (ref 38–126)
Anion gap: 8 (ref 5–15)
BUN: 16 mg/dL (ref 8–23)
CO2: 24 mmol/L (ref 22–32)
Calcium: 9 mg/dL (ref 8.9–10.3)
Chloride: 104 mmol/L (ref 98–111)
Creatinine, Ser: 0.65 mg/dL (ref 0.61–1.24)
GFR, Estimated: >60 mL/min (ref >60)
Glucose, Bld: 120 mg/dL — ABNORMAL HIGH (ref 70–99)
Potassium: 3.9 mmol/L (ref 3.5–5.1)
Sodium: 136 mmol/L (ref 135–145)
Total Bilirubin: 0.9 mg/dL (ref 0.3–1.2)
Total Protein: 6.8 g/dL (ref 6.5–8.1)

## 2022-07-25 LAB — CBC WITH DIFFERENTIAL/PLATELET
Abs Immature Granulocytes: 0.03 K/uL (ref 0.00–0.07)
Basophils Absolute: 0 K/uL (ref 0.0–0.1)
Basophils Relative: 0 %
Eosinophils Absolute: 0 K/uL (ref 0.0–0.5)
Eosinophils Relative: 0 %
HCT: 42.4 % (ref 39.0–52.0)
Hemoglobin: 13.7 g/dL (ref 13.0–17.0)
Immature Granulocytes: 0 %
Lymphocytes Relative: 15 %
Lymphs Abs: 1.4 K/uL (ref 0.7–4.0)
MCH: 28.9 pg (ref 26.0–34.0)
MCHC: 32.3 g/dL (ref 30.0–36.0)
MCV: 89.5 fL (ref 80.0–100.0)
Monocytes Absolute: 0.5 K/uL (ref 0.1–1.0)
Monocytes Relative: 5 %
Neutro Abs: 7.3 K/uL (ref 1.7–7.7)
Neutrophils Relative %: 80 %
Platelets: 228 K/uL (ref 150–400)
RBC: 4.74 MIL/uL (ref 4.22–5.81)
RDW: 14.6 % (ref 11.5–15.5)
WBC: 9.3 K/uL (ref 4.0–10.5)
nRBC: 0 % (ref 0.0–0.2)

## 2022-07-25 LAB — URINALYSIS, ROUTINE W REFLEX MICROSCOPIC
Bilirubin Urine: NEGATIVE
Glucose, UA: >=500 mg/dL — AB
Hgb urine dipstick: NEGATIVE
Ketones, ur: 5 mg/dL — AB
Nitrite: NEGATIVE
Protein, ur: 30 mg/dL — AB
Specific Gravity, Urine: 1.03 (ref 1.005–1.030)
WBC, UA: >50 WBC/hpf — ABNORMAL HIGH (ref 0–5)
pH: 6 (ref 5.0–8.0)

## 2022-07-25 LAB — PROTIME-INR
INR: 2.3 — ABNORMAL HIGH (ref 0.8–1.2)
Prothrombin Time: 25.4 seconds — ABNORMAL HIGH (ref 11.4–15.2)

## 2022-07-25 LAB — CK: Total CK: 74 U/L (ref 49–397)

## 2022-07-25 LAB — LIPASE, BLOOD: Lipase: 28 U/L (ref 11–51)

## 2022-07-25 MED ORDER — SODIUM CHLORIDE 0.9 % IV BOLUS
1000.0000 mL | Freq: Once | INTRAVENOUS | Status: AC
  Administered 2022-07-25: 1000 mL via INTRAVENOUS

## 2022-07-25 MED ORDER — ONDANSETRON 4 MG PO TBDP: 4.0000 mg | ORAL_TABLET | Freq: Three times a day (TID) | ORAL | 0 refills | Status: DC | PRN

## 2022-07-25 MED ORDER — IOHEXOL 300 MG/ML  SOLN
100.0000 mL | Freq: Once | INTRAMUSCULAR | Status: AC | PRN
  Administered 2022-07-25: 100 mL via INTRAVENOUS

## 2022-07-25 MED ORDER — ONDANSETRON HCL 4 MG/2ML IJ SOLN
4.0000 mg | Freq: Once | INTRAMUSCULAR | Status: AC
  Administered 2022-07-25: 4 mg via INTRAVENOUS
  Filled 2022-07-25: qty 2

## 2022-07-25 MED ORDER — TAMSULOSIN HCL 0.4 MG PO CAPS: 0.4000 mg | ORAL_CAPSULE | Freq: Every day | ORAL | 0 refills | Status: AC

## 2022-07-25 MED ORDER — SODIUM CHLORIDE 0.9 % IV SOLN
500.0000 mg | INTRAVENOUS | Status: AC
  Administered 2022-07-25: 500 mg via INTRAVENOUS
  Filled 2022-07-25: qty 10

## 2022-07-25 NOTE — Discharge Instructions (Addendum)
1) please continue taking your antibiotic as prescribed for treatment of your urinary tract infection.  As indicated follow-up with your urologist or send a MyChart message to your urologist about your catheter placement today for urinary retention and ask for a voiding trial early next week. 2) take Zofran every 8 hours as needed for nausea and vomiting.  Please try to schedule your fluid and food intake so that you are not getting dehydrated.  Return to the emergency department immediately for the reasons we discussed including inability to hold down any foods or fluids, severe abdominal pain, distention, inability to make any bowel movements or pass gas, severe fever or worsening in your condition.

## 2022-07-25 NOTE — ED Provider Notes (Signed)
[facility] DEPT Provider Note   CSN: [id number] Arrival date & time: 07/25/22  0555     History  Chief Complaint  Patient presents with   Back Pain    Mitchell Bright is a 71 y.o. male who presents to the emergency department with chief complaint of nausea and vomiting.  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  He has a history of recurrent bowel obstructions.  Patient states that for the past 2 days he has had worsening nausea, vomiting.  He made 1 small bowel movement yesterday after taking several doses of MiraLAX but has been unable to make a bowel movement since then.  He has not been passing gas.  He has had to have NG tube placed in the past for decompression of his stomach.  He recently saw his urologist last week and was told that he had a recurrent urinary tract infection and so is currently on both daptomycin and Bactrim which she has had 2 doses.  He feels like he has had difficulty urinating and that he may have some retention.  He denies fevers, chills or severe abdominal pain.   Back Pain      Home Medications Prior to Admission medications   Medication Sig Start Date End Date Taking? Authorizing Provider  acetaminophen (TYLENOL) 325 MG tablet Take 650 mg by mouth every 6 (six) hours as needed for mild pain.    [provider]  daptomycin (CUBICIN) IVPB Inject 500 mg into the vein daily for 18 days. Indication:  MRSE spine osteo First Dose: Yes Last Day of Therapy:  07/27/22 Labs - Once weekly:  CBC/D, BMP, and  CPK Labs - Every other week:  ESR and CRP Method of administration: IV Push Method of administration may be changed at the discretion of home infusion pharmacist based upon assessment of the patient and/or caregiver's ability to self-administer the medication ordered. 07/13/22 07/31/22  Antonieta Pert, MD  digoxin (LANOXIN) 0.125 MG tablet Take 0.125 mg by mouth daily.    [provider]  diltiazem (DILACOR XR) 180 MG 24 hr capsule Take 180 mg by mouth daily.      [provider]  DULoxetine (CYMBALTA) 30 MG capsule Take 30 mg by mouth daily.    [provider]  Empagliflozin-metFORMIN HCl ER (SYNJARDY XR) 12.01-999 MG TB24 Take 1 tablet by mouth 2 (two) times daily with a meal.    [provider]  fenofibrate 160 MG tablet Take 160 mg by mouth daily.    [provider]  Glucose (TRUEPLUS GLUCOSE) 15 GM/32ML GEL Take 32 mLs by mouth as needed (for hypoglycemia).    [provider]  insulin glargine (LANTUS) 100 UNIT/ML injection Inject 0.1 mLs (10 Units total) into the skin at bedtime. 07/13/22   Antonieta Pert, MD  lamoTRIgine (LAMICTAL) 100 MG tablet Take 100 mg by mouth daily.    [provider]  lidocaine 4 % Place 1 patch onto the skin daily.    [provider]  melatonin 1 MG TABS tablet Take 1 mg by mouth at bedtime.    [provider]  melatonin 3 MG TABS  tablet Take 3 mg by mouth at bedtime.    [provider]  methocarbamol (ROBAXIN) 500 MG tablet Take 750 mg by mouth 4 (four) times daily. For one month with last day on 07/26/22    [provider]  ondansetron (ZOFRAN) 4 MG tablet Take 4 mg by mouth every 8 (eight) hours as needed for nausea or vomiting.    [provider]  pantoprazole (PROTONIX) 40 MG tablet Take 1 tablet (40 mg total) by mouth daily. 07/13/22 08/12/22  Antonieta Pert, MD  polyethylene glycol (MIRALAX / GLYCOLAX) 17 g packet Take 17 g by mouth daily.    [provider]   pregabalin (LYRICA) 100 MG capsule Take 100 mg by mouth at bedtime.    [provider]  promethazine (PHENERGAN) 25 MG/ML injection Inject 12.5 mg into the muscle every 6 (six) hours as needed for nausea or vomiting.    [provider]  senna (SENOKOT) 8.6 MG TABS tablet Take 2 tablets by mouth daily as needed for mild constipation.    [provider]  simethicone (MYLICON) 269 MG chewable tablet Chew 125 mg by mouth 4 (four) times daily - after meals and at bedtime.    [provider]  sodium phosphate Pediatric (FLEET) 3.5-9.5 GM/59ML enema Place 1 enema rectally daily as needed for severe constipation.    [provider]  tamsulosin (FLOMAX) 0.4 MG CAPS capsule Take 0.4 mg by mouth daily.    [provider]  warfarin (COUMADIN) 5 MG tablet Adjustment of Coumadin dose to maintain INR 2-3 07/13/22   Antonieta Pert, MD      Allergies    Eliquis [apixaban] and Oseltamivir phosphate    Review of Systems   Review of Systems  Musculoskeletal:  Positive for back pain.    Physical Exam Updated Vital Signs BP 110/77   Pulse 69   Temp 97.9 F (36.6 C) (Oral)   Resp 18   Ht _0  (1.778 m)   Wt 71.7 kg   SpO2 98%   BMI 22.67 kg/m  Physical Exam Vitals and nursing note reviewed.  Constitutional:      General: He is not in acute distress.    Appearance: He is well-developed. He is not diaphoretic.  HENT:     Head: Normocephalic and atraumatic.  Eyes:     General: No scleral icterus.    Conjunctiva/sclera: Conjunctivae normal.  Cardiovascular:     Rate and Rhythm: Normal rate and regular rhythm.     Heart sounds: Normal heart sounds.  Pulmonary:     Effort: Pulmonary effort is normal. No respiratory distress.     Breath sounds: Normal breath sounds.  Abdominal:     General: There is distension.     Palpations: Abdomen is soft.     Tenderness: There is no abdominal tenderness.  Musculoskeletal:     Cervical back: Normal range of  motion and neck supple.  Skin:    General: Skin is warm and dry.  Neurological:     Mental Status: He is alert.  Psychiatric:        Behavior: Behavior normal.   Firmness and distention in the lower part of the abdomen.  Well-healed midline surgical scar present.  PICC line present in the left upper extremity no signs of infection  ED Results / Procedures / Treatments   Labs (all labs ordered are listed, but only abnormal results are displayed) Labs Reviewed - No data to display  EKG None  Radiology No results found.  Procedures Procedures    Medications Ordered in ED Medications - No data to display  ED Course/ Medical Decision Making/ A&P Clinical Course as of 07/25/22 1120  Sun Jul 25, 2022  0926 Patient has urinary retention, postvoid residual of about 300 mL.  I have ordered a Foley catheter [AH]  [id number] I reviewed outside records.  Patient's recent urology visit showed UTI.  Culture showed greater than 100,000 units of growth of Enterobacter Cloacae.  Culture and sensitivity report shows multiple resistances however it is susceptible to Bactrim which is what the patient is currently taking.  He has only had 2 doses.  He does have urinary retention which may be why he is having persistent symptoms. [AH]    Clinical Course User Index [AH] Margarita Mail, PA-C                           Medical Decision Making This patient presents to the ED for concern of n/v, this involves an extensive number of treatment options, and is a complaint that carries with it a high risk of complications and morbidity.  The emergent differential diagnosis for vomiting includes, but is not limited to ACS/MI, DKA, Ischemic bowel, Meningitis, Sepsis, Acute gastric dilation, Adrenal insufficiency, Appendicitis,  Bowel obstruction/ileus, Carbon monoxide poisoning, Cholecystitis, Electrolyte abnormalities, Elevated ICP, Gastric outlet obstruction, Pancreatitis, Ruptured viscus, Biliary colic,  Cannabinoid hyperemesis syndrome, Gastritis, Gastroenteritis, Gastroparesis,  Narcotic withdrawal, Peptic ulcer disease, and UTI      Co morbidities that complicate the patient evaluation       History of bowel obstructions, diabetes, urinary tract infections, urinary retention and current treatment for osteomyelitis   Additional history obtained:  {Additional history obtained from patient's wife at bedside {External records from outside source obtained and reviewed including outpatient urology notes, labs, culture and sensitivity report   Lab Tests:  I Ordered, and personally interpreted labs.  The pertinent results include:   Urine appears infected, no elevated white blood cell count, mild hyperglycemia of insignificant value, lipase within normal limit, INR 2.3, CK within normal limits   Imaging Studies ordered:  I ordered imaging studies including acute abdominal series and CT abdomen and pelvis I independently visualized and interpreted imaging which showed no evidence of bowel obstruction I agree with the radiologist interpretation   Cardiac Monitoring/ECG:       The patient was maintained on a cardiac monitor.  I personally viewed and interpreted the cardiac monitored which showed an underlying rhythm of: Sinus rhythm      Medicines ordered and prescription drug management:  I ordered medication including Medications sodium chloride 0.9 % bolus 1,000 mL (1,000 mLs Intravenous New Bag/Given 07/25/22 0813) ondansetron (ZOFRAN) injection 4 mg (4 mg Intravenous Given 07/25/22 0813) iohexol (OMNIPAQUE) 300 MG/ML solution 100 mL (100 mLs Intravenous Contrast Given 07/25/22 0907) DAPTOmycin (CUBICIN) 500 mg in sodium chloride 0.9 % IVPB (500 mg Intravenous New Bag/Given 07/25/22 1027) for nausea and vomiting Reevaluation of the patient after these medicines showed that the patient resolved I have reviewed the patients home medicines and have made adjustments as  needed      Critical Interventions:            Problem List / ED Course:       (R11.2) Nausea and vomiting, unspecified vomiting type  (primary encounter diagnosis)  (R63.0) Poor appetite  (R33.8) Acute urinary retention  (N30.01) Acute cystitis with hematuria  Reevaluation:  After the interventions noted above, I reevaluated the patient and found that they have :resolved   Social Determinants of Health:       Strong outpatient follow-up, strong social support   Dispostion:  After consideration of the diagnostic results and the patients response to treatment, I feel that the patent would benefit from discharge.    Amount and/or Complexity of Data Reviewed Labs: ordered. Radiology: ordered. ECG/medicine tests: ordered.  Risk Prescription drug management.   Patient here with nausea vomiting, extensive past medical history and concern for bowel obstruction.  He has no evidence of such.  He does have persistent urinary tract infection and acute urinary retention.  Patient will be discharged home with a catheter and leg bag in place.  Suggest voiding trial this week with his urologist.  Patient also currently taking Bactrim for UTI which appears to be effective against his infection.  Encouraged him to continue taking this.  I suspect his nausea may be secondary to antibiotic use and he did well after taking Zofran.  Patient will be discharged with Zofran.  Discussed outpatient follow-up and return precautions.        Final Clinical Impression(s) / ED Diagnoses Final diagnoses:  Nausea and vomiting, unspecified vomiting type  Poor appetite  Acute urinary retention  Acute cystitis with hematuria    Rx / DC Orders ED Discharge Orders     None         Margarita Mail, PA-C 07/25/22 1124    Lajean Saver, MD 07/25/22 1414

## 2022-07-25 NOTE — Progress Notes (Signed)
Pharmacy Antibiotic Note  Mitchell Bright is a 70 y.o. male presented to [facility] ED on 07/25/2022 with back pain. Patient has PMH of MRSE spine osteomyelitis on 6 weeks of daptomycin per ID recommendations at [facility]. Pharmacy has been consulted to resume PTA Daptomycin.   SCr 0.65, CrCl ~ 86 ml/min CK = 74 Per patient, last dose of Daptomycin received 07/24/22 at 0800  Plan: Daptomycin '500mg'$  IV q24h x 3 more days to complete 6 week course (end date per ID recommendations)  Height: '5\' 10"'$  (177.8 cm) Weight: 71.7 kg (158 lb) IBW/kg (Calculated) : 73  Temp (24hrs), Avg:98 F (36.7 C), Min:97.9 F (36.6 C), Max:98.1 F (36.7 C)  Recent Labs  Lab 07/25/22 0815  WBC 9.3  CREATININE 0.65    Estimated Creatinine Clearance: 85.9 mL/min (by C-G formula based on SCr of 0.65 mg/dL).    Allergies  Allergen Reactions   Eliquis [Apixaban] Nausea And Vomiting   Oseltamivir Phosphate     Thank you for allowing pharmacy to be a part of this patient's care.  Lindell Spar, PharmD, BCPS Clinical Pharmacist  07/25/2022 10:29 AM

## 2022-07-25 NOTE — ED Notes (Signed)
Given applesauce

## 2022-07-25 NOTE — ED Notes (Signed)
453m's & 363 mL's presented on the bladder scanner.

## 2022-07-25 NOTE — ED Triage Notes (Signed)
   Patient BIB from home for lower back pain.  Patient had laminectomy L2-L3 in May of this year, osteomyelitis last month, and admission for SBO earlier this month.  Patient states he has had increased numbness and tingling in his lower extremities but can ambulate with walker at home.  Patient states he has not had a decent bowel movement in several days.  Pain 6/10, soreness in lower back.

## 2022-07-26 LAB — URINE CULTURE: Culture: <10000 — AB

## 2024-02-29 ENCOUNTER — Emergency Department (HOSPITAL_COMMUNITY)

## 2024-02-29 ENCOUNTER — Inpatient Hospital Stay (HOSPITAL_COMMUNITY)
Admission: EM | Admit: 2024-02-29 | Discharge: 2024-03-04 | DRG: 378 | Disposition: A | Discharge status: Home / Self Care | Attending: Internal Medicine | Admitting: Internal Medicine

## 2024-02-29 ENCOUNTER — Encounter (HOSPITAL_COMMUNITY): Payer: Self-pay

## 2024-02-29 DIAGNOSIS — I10 Essential (primary) hypertension: Secondary | ICD-10-CM | POA: Diagnosis present

## 2024-02-29 DIAGNOSIS — Z79899 Other long term (current) drug therapy: Secondary | ICD-10-CM

## 2024-02-29 DIAGNOSIS — E7800 Pure hypercholesterolemia, unspecified: Secondary | ICD-10-CM | POA: Diagnosis present

## 2024-02-29 DIAGNOSIS — E441 Mild protein-calorie malnutrition: Secondary | ICD-10-CM | POA: Diagnosis present

## 2024-02-29 DIAGNOSIS — Z794 Long term (current) use of insulin: Secondary | ICD-10-CM

## 2024-02-29 DIAGNOSIS — K284 Chronic or unspecified gastrojejunal ulcer with hemorrhage: Principal | ICD-10-CM | POA: Diagnosis present

## 2024-02-29 DIAGNOSIS — K922 Gastrointestinal hemorrhage, unspecified: Secondary | ICD-10-CM | POA: Diagnosis present

## 2024-02-29 DIAGNOSIS — R55 Syncope and collapse: Principal | ICD-10-CM

## 2024-02-29 DIAGNOSIS — I482 Chronic atrial fibrillation, unspecified: Secondary | ICD-10-CM | POA: Diagnosis present

## 2024-02-29 DIAGNOSIS — G40909 Epilepsy, unspecified, not intractable, without status epilepticus: Secondary | ICD-10-CM | POA: Diagnosis present

## 2024-02-29 DIAGNOSIS — I480 Paroxysmal atrial fibrillation: Secondary | ICD-10-CM | POA: Diagnosis present

## 2024-02-29 DIAGNOSIS — D500 Iron deficiency anemia secondary to blood loss (chronic): Secondary | ICD-10-CM | POA: Diagnosis present

## 2024-02-29 DIAGNOSIS — E119 Type 2 diabetes mellitus without complications: Secondary | ICD-10-CM

## 2024-02-29 DIAGNOSIS — D62 Acute posthemorrhagic anemia: Secondary | ICD-10-CM | POA: Diagnosis present

## 2024-02-29 DIAGNOSIS — Z9049 Acquired absence of other specified parts of digestive tract: Secondary | ICD-10-CM

## 2024-02-29 DIAGNOSIS — K640 First degree hemorrhoids: Secondary | ICD-10-CM | POA: Diagnosis present

## 2024-02-29 DIAGNOSIS — E1142 Type 2 diabetes mellitus with diabetic polyneuropathy: Secondary | ICD-10-CM | POA: Diagnosis present

## 2024-02-29 DIAGNOSIS — K2970 Gastritis, unspecified, without bleeding: Secondary | ICD-10-CM | POA: Diagnosis present

## 2024-02-29 DIAGNOSIS — E1165 Type 2 diabetes mellitus with hyperglycemia: Secondary | ICD-10-CM | POA: Diagnosis present

## 2024-02-29 DIAGNOSIS — K921 Melena: Secondary | ICD-10-CM | POA: Diagnosis present

## 2024-02-29 DIAGNOSIS — Z7901 Long term (current) use of anticoagulants: Secondary | ICD-10-CM

## 2024-02-29 DIAGNOSIS — I4891 Unspecified atrial fibrillation: Secondary | ICD-10-CM | POA: Diagnosis present

## 2024-02-29 DIAGNOSIS — R791 Abnormal coagulation profile: Secondary | ICD-10-CM | POA: Diagnosis present

## 2024-02-29 DIAGNOSIS — K633 Ulcer of intestine: Principal | ICD-10-CM | POA: Diagnosis present

## 2024-02-29 LAB — COMPREHENSIVE METABOLIC PANEL WITH GFR
ALT: 13 U/L (ref 0–44)
AST: 23 U/L (ref 15–41)
Albumin: 3.3 g/dL — ABNORMAL LOW (ref 3.5–5.0)
Alkaline Phosphatase: 26 U/L — ABNORMAL LOW (ref 38–126)
Anion gap: 10 (ref 5–15)
BUN: 24 mg/dL — ABNORMAL HIGH (ref 8–23)
CO2: 20 mmol/L — ABNORMAL LOW (ref 22–32)
Calcium: 8.2 mg/dL — ABNORMAL LOW (ref 8.9–10.3)
Chloride: 105 mmol/L (ref 98–111)
Creatinine, Ser: 0.78 mg/dL (ref 0.61–1.24)
GFR, Estimated: >60 mL/min (ref >60)
Glucose, Bld: 193 mg/dL — ABNORMAL HIGH (ref 70–99)
Potassium: 4.4 mmol/L (ref 3.5–5.1)
Sodium: 135 mmol/L (ref 135–145)
Total Bilirubin: 0.8 mg/dL (ref 0.0–1.2)
Total Protein: 5.6 g/dL — ABNORMAL LOW (ref 6.5–8.1)

## 2024-02-29 LAB — CBC
HCT: 23.7 % — ABNORMAL LOW (ref 39.0–52.0)
Hemoglobin: 6.7 g/dL — CL (ref 13.0–17.0)
MCH: 19.3 pg — ABNORMAL LOW (ref 26.0–34.0)
MCHC: 28.3 g/dL — ABNORMAL LOW (ref 30.0–36.0)
MCV: 68.1 fL — ABNORMAL LOW (ref 80.0–100.0)
Platelets: 274 10*3/uL (ref 150–400)
RBC: 3.48 MIL/uL — ABNORMAL LOW (ref 4.22–5.81)
RDW: 18.6 % — ABNORMAL HIGH (ref 11.5–15.5)
WBC: 11 10*3/uL — ABNORMAL HIGH (ref 4.0–10.5)
nRBC: 0 % (ref 0.0–0.2)

## 2024-02-29 LAB — URINALYSIS, ROUTINE W REFLEX MICROSCOPIC
Bacteria, UA: NONE SEEN
Bilirubin Urine: NEGATIVE
Glucose, UA: >=500 mg/dL — AB
Hgb urine dipstick: NEGATIVE
Ketones, ur: 5 mg/dL — AB
Leukocytes,Ua: NEGATIVE
Nitrite: NEGATIVE
Protein, ur: NEGATIVE mg/dL
Specific Gravity, Urine: 1.025 (ref 1.005–1.030)
pH: 5 (ref 5.0–8.0)

## 2024-02-29 LAB — APTT: aPTT: 64 s — ABNORMAL HIGH (ref 24–36)

## 2024-02-29 LAB — PREPARE RBC (CROSSMATCH)

## 2024-02-29 LAB — PROTIME-INR
INR: >10 (ref 0.8–1.2)
Prothrombin Time: >90 s — ABNORMAL HIGH (ref 11.4–15.2)

## 2024-02-29 LAB — CBG MONITORING, ED: Glucose-Capillary: 222 mg/dL — ABNORMAL HIGH (ref 70–99)

## 2024-02-29 LAB — ABO/RH: ABO/RH(D): O POS

## 2024-02-29 LAB — POC OCCULT BLOOD, ED: Fecal Occult Bld: POSITIVE — AB

## 2024-02-29 MED ORDER — IOHEXOL 350 MG/ML SOLN
100.0000 mL | Freq: Once | INTRAVENOUS | Status: AC | PRN
  Administered 2024-03-01: 100 mL via INTRAVENOUS

## 2024-02-29 MED ORDER — PANTOPRAZOLE SODIUM 40 MG IV SOLR
40.0000 mg | Freq: Two times a day (BID) | INTRAVENOUS | Status: DC
  Administered 2024-03-01 – 2024-03-04 (×7): 40 mg via INTRAVENOUS
  Filled 2024-02-29 (×7): qty 10

## 2024-02-29 MED ORDER — SODIUM CHLORIDE 0.9% IV SOLUTION: Freq: Once | INTRAVENOUS | Status: AC

## 2024-02-29 MED ORDER — PANTOPRAZOLE SODIUM 40 MG IV SOLR
40.0000 mg | INTRAVENOUS | Status: AC
  Administered 2024-02-29 (×2): 40 mg via INTRAVENOUS
  Filled 2024-02-29 (×2): qty 10

## 2024-02-29 MED ORDER — VITAMIN K1 10 MG/ML IJ SOLN
5.0000 mg | Freq: Once | INTRAVENOUS | Status: AC
  Administered 2024-03-01: 5 mg via INTRAVENOUS
  Filled 2024-02-29: qty 0.5

## 2024-02-29 NOTE — ED Provider Notes (Signed)
 [facility] EMERGENCY DEPARTMENT AT [facility] Provider Note   CSN: [id number] Arrival date & time: 02/29/24  1835     History {Add pertinent medical, surgical, social history, OB history to HPI:1} Chief Complaint  Patient presents with   Loss of Consciousness    Mitchell Bright is a 73 y.o. male with PMH as listed below who presents with syncopal event.  Patient was walking into a restaurant this evening when he suddenly became diaphoretic and some patrons of the restaurant were able to help lower him into a booth, he did lose consciousness briefly and became pale and clammy according to his wife.  This resolved quickly.  He has a history of A-fib and is on warfarin, digoxin , Cardizem .  Has noticed some dark stools over the last day or so.  Denies chest pain, shortness of breath, abdominal pain.  No recent illness.  Patient traveled last weekend to [place]  for a wedding. Takes coumadin  for Afib. Has h/o small bowel resection and small carcinoid tumor resected many years ago. Has history of gi bleeding many years ago as well. Denies further lightheadedness. Denies abdominal pain. States his stools have been black for the last 12 hours.    Past Medical History:  Diagnosis Date   Atrial fibrillation (HCC)    chronic   Carcinoid tumor    Carotid artery dissection (HCC)    History of previous spontaneous carotid artery dissection   Diabetes mellitus    Hyperlipidemia    S/P small bowel resection    History of a partial bowel resection for a carcinoid   Seizure disorder (HCC)        Home Medications Prior to Admission medications   Medication Sig Start Date End Date Taking? Authorizing Provider  acetaminophen  (TYLENOL ) 325 MG tablet Take 650 mg by mouth every 6 (six) hours as needed for mild pain.    [provider]  digoxin  (LANOXIN ) 0.125 MG tablet Take 0.125 mg by mouth daily.    [provider]  diltiazem  (DILACOR XR ) 180 MG 24 hr capsule Take  180 mg by mouth daily.      [provider]  DULoxetine  (CYMBALTA ) 30 MG capsule Take 30 mg by mouth daily.    [provider]  Empagliflozin-metFORMIN HCl ER (SYNJARDY XR) 12.01-999 MG TB24 Take 1 tablet by mouth 2 (two) times daily with a meal.    [provider]  fenofibrate  160 MG tablet Take 160 mg by mouth daily.    [provider]  Glucose (TRUEPLUS GLUCOSE) 15 GM/32ML GEL Take 32 mLs by mouth as needed (for hypoglycemia).    [provider]  insulin  glargine (LANTUS ) 100 UNIT/ML injection Inject 0.1 mLs (10 Units total) into the skin at bedtime. 07/13/22   Christobal Guadalajara, MD  lamoTRIgine  (LAMICTAL ) 100 MG tablet Take 100 mg by mouth daily.    [provider]  lidocaine 4 % Place 1 patch onto the skin daily.    [provider]  melatonin 1 MG TABS tablet Take 1 mg by mouth at bedtime.    [provider]  melatonin 3 MG TABS tablet Take 3 mg by mouth at bedtime.    [provider]  methocarbamol  (ROBAXIN ) 500 MG tablet Take 750 mg by mouth 4 (four) times daily. For one month with last day on 07/26/22    [provider]  ondansetron  (ZOFRAN ) 4 MG tablet Take 4 mg by mouth every 8 (eight) hours as needed for nausea or  vomiting.    [provider]  ondansetron  (ZOFRAN -ODT) 4 MG disintegrating tablet Take 1 tablet (4 mg total) by mouth every 8 (eight) hours as needed for nausea or vomiting. 4mg  ODT q4 hours prn nausea/vomit 07/25/22   Harris, Abigail, PA-C  pantoprazole  (PROTONIX ) 40 MG tablet Take 1 tablet (40 mg total) by mouth daily. 07/13/22 08/12/22  Christobal Guadalajara, MD  polyethylene glycol (MIRALAX / GLYCOLAX) 17 g packet Take 17 g by mouth daily.    [provider]  pregabalin  (LYRICA ) 100 MG capsule Take 100 mg by mouth at bedtime.    [provider]  promethazine (PHENERGAN) 25 MG/ML injection Inject 12.5 mg into the muscle every 6 (six) hours as needed for nausea or vomiting.     [provider]  senna (SENOKOT) 8.6 MG TABS tablet Take 2 tablets by mouth daily as needed for mild constipation.    [provider]  simethicone (MYLICON) 125 MG chewable tablet Chew 125 mg by mouth 4 (four) times daily - after meals and at bedtime.    [provider]  sodium phosphate Pediatric (FLEET) 3.5-9.5 GM/59ML enema Place 1 enema rectally daily as needed for severe constipation.    [provider]  tamsulosin  (FLOMAX ) 0.4 MG CAPS capsule Take 1 capsule (0.4 mg total) by mouth daily. 07/25/22   Harris, Abigail, PA-C  warfarin (COUMADIN ) 5 MG tablet Adjustment of Coumadin  dose to maintain INR 2-3 07/13/22   Christobal Guadalajara, MD      Allergies    Eliquis [apixaban] and Oseltamivir phosphate    Review of Systems   Review of Systems A 10 point review of systems was performed and is negative unless otherwise reported in HPI.  Physical Exam Updated Vital Signs BP 114/63   Pulse (!) 120   Temp 98.4 F (36.9 C) (Oral)   Resp 16   SpO2 90%  Physical Exam General: Normal appearing male, lying in bed.  HEENT: PERRLA, Sclera anicteric, MMM, trachea midline.  Cardiology: RRR, no murmurs/rubs/gallops. BL radial and DP pulses equal bilaterally.  Resp: Normal respiratory rate and effort. CTAB, no wheezes, rhonchi, crackles.  Abd: Soft, non-tender, non-distended. No rebound tenderness or guarding.  Rectal: Normal appearing external anal sphincter.  No hemorrhoids externally.  No rectal masses or hemorrhoids palpated.  Dark maroon bloody stool noted on glove. MSK: No peripheral edema or signs of trauma. Extremities without deformity or TTP. No cyanosis or clubbing. Skin: warm, dry. No rashes or lesions. Back: No CVA tenderness Neuro: A&Ox4, CNs II-XII grossly intact. MAEs. Sensation grossly intact.  Psych: Normal mood and affect.   ED Results / Procedures / Treatments   Labs (all labs ordered are listed, but only abnormal results are displayed) Labs  Reviewed  COMPREHENSIVE METABOLIC PANEL WITH GFR - Abnormal; Notable for the following components:      Result Value   CO2 20 (*)    Glucose, Bld 193 (*)    BUN 24 (*)    Calcium 8.2 (*)    Total Protein 5.6 (*)    Albumin 3.3 (*)    Alkaline Phosphatase 26 (*)    All other components within normal limits  CBC - Abnormal; Notable for the following components:   WBC 11.0 (*)    RBC 3.48 (*)    Hemoglobin 6.7 (*)    HCT 23.7 (*)    MCV 68.1 (*)    MCH 19.3 (*)    MCHC 28.3 (*)    RDW 18.6 (*)  All other components within normal limits  URINALYSIS, ROUTINE W REFLEX MICROSCOPIC  CBG MONITORING, ED  CBG MONITORING, ED  POC OCCULT BLOOD, ED  TYPE AND SCREEN  PREPARE RBC (CROSSMATCH)    EKG EKG Interpretation Date/Time:  Wednesday February 29 2024 18:58:14 EDT Ventricular Rate:  73 PR Interval:    QRS Duration:  144 QT Interval:  394 QTC Calculation: 435 R Axis:   41  Text Interpretation: Atrial fibrillation Right bundle branch block Confirmed by Franklyn Gills [phone]) on 02/29/2024 8:07:58 PM  Radiology No results found.  Procedures Procedures  {Document cardiac monitor, telemetry assessment procedure when appropriate:1}  Medications Ordered in ED Medications  0.9 %  sodium chloride  infusion (Manually program via Guardrails IV Fluids) (has no administration in time range)    ED Course/ Medical Decision Making/ A&P                          Medical Decision Making Amount and/or Complexity of Data Reviewed Labs: ordered. Decision-making details documented in ED Course. Radiology: ordered.  Risk Prescription drug management.    This patient presents to the ED for concern of ***, this involves an extensive number of treatment options, and is a complaint that carries with it a high risk of complications and morbidity.  I considered the following differential and admission for this acute, potentially life threatening condition.   MDM:    Based on clinical  presentation, hematochezia reported and clinical exam history and findings, most concerned about LGIB.   DDx for LGIB includes but is not limited to:  Hemorrhoids, anal fissure, rectal foreign body, diverticulitis/diverticulosis, ischemic colitis, IBD, AVMs or Dieulafoy lesions, infectious colitis, radiation exposure, malignancy.   Lower suspicion for radiation exposure, given lack of report. No c/f straining or hemorrhoidal etiology.***  Update and Plan: -Rockall Score: Variable Age Shock Comorbidity Diagnosis Evidence of Bleeding  0 point < 60 No shock None  None  1 point 60-79 Pulse > 100 SBP > 100  MWT   2 points > 80 SBP < 100 CHF CAD Other All other diseases Blood Adherent clot Spurting vessel  3 points   ARF Liver disease Metastatic cancer    Score < 3 points--good prognosis; score > 8--poor prognosis. Score: *** -two large bore IVs, on the monitor -cbc, cmp, coags, type and screen, mag -starting on protonix  bolus and gtt (80 then 58ml/hr) Marsha CLORA Hattie GRACIELA Alena JOLYNN,  et al. Omeprazole before endoscopy in patients with gastrointestinal bleeding. N Engl J Med. 2007;356(16):1631-1640.) -start ocretotide *** -starting ceftriaxone *** (Garcia-Tsao  G, Sanyal  AJ, Grace  ND,  et al. Prevention and management of gastroesophageal varices and variceal hemorrhage in cirrhosis. Hepatology. 2007;46(3):922-938.) -goal transfusion Hb <7 Elinor  C, Colomo  A, Bosch  A,  et al. Transfusion strategies for acute upper gastrointestinal bleeding. N Engl J Med. 2013;368(1):11-21.)      Clinical Course as of 02/29/24 2008  Wed Feb 29, 2024  2007 Hemoglobin(!!): 6.7 [HN]    Clinical Course User Index [HN] Franklyn Gills SAILOR, MD    Labs: I Ordered, and personally interpreted labs.  The pertinent results include:  ***  Imaging Studies ordered: I ordered imaging studies including *** I independently visualized and interpreted imaging. I agree with the radiologist  interpretation  Additional history obtained from ***.  External records from outside source obtained and reviewed including ***  Cardiac Monitoring: The patient was maintained on a cardiac monitor.  I personally viewed and interpreted the cardiac monitored which showed an underlying rhythm of: ***  Reevaluation: After the interventions noted above, I reevaluated the patient and found that they have :{resolved/improved/worsened:23923::improved}  Social Determinants of Health: ***  Disposition:  ***  Co morbidities that complicate the patient evaluation  Past Medical History:  Diagnosis Date   Atrial fibrillation (HCC)    chronic   Carcinoid tumor    Carotid artery dissection (HCC)    History of previous spontaneous carotid artery dissection   Diabetes mellitus    Hyperlipidemia    S/P small bowel resection    History of a partial bowel resection for a carcinoid   Seizure disorder (HCC)      Medicines Meds ordered this encounter  Medications   0.9 %  sodium chloride  infusion (Manually program via Guardrails IV Fluids)    I have reviewed the patients home medicines and have made adjustments as needed  Problem List / ED Course: Problem List Items Addressed This Visit   None        {Document critical care time when appropriate:1} {Document review of labs and clinical decision tools ie heart score, Chads2Vasc2 etc:1}  {Document your independent review of radiology images, and any outside records:1} {Document your discussion with family members, caretakers, and with consultants:1} {Document social determinants of health affecting pt's care:1} {Document your decision making why or why not admission, treatments were needed:1}  This note was created using dictation software, which may contain spelling or grammatical errors.

## 2024-02-29 NOTE — ED Provider Notes (Incomplete)
 [place] EMERGENCY DEPARTMENT AT [facility] Provider Note   CSN: [id number] Arrival date & time: 02/29/24  1835     History {Add pertinent medical, surgical, social history, OB history to HPI:1} Chief Complaint  Patient presents with  . Loss of Consciousness    Mitchell Bright is a 73 y.o. male with PMH as listed below who presents with ***.    Past Medical History:  Diagnosis Date  . Atrial fibrillation (HCC)    chronic  . Carcinoid tumor   . Carotid artery dissection (HCC)    History of previous spontaneous carotid artery dissection  . Diabetes mellitus   . Hyperlipidemia   . S/P small bowel resection    History of a partial bowel resection for a carcinoid  . Seizure disorder (HCC)        Home Medications Prior to Admission medications   Medication Sig Start Date End Date Taking? Authorizing Provider  acetaminophen  (TYLENOL ) 325 MG tablet Take 650 mg by mouth every 6 (six) hours as needed for mild pain.    [provider]  digoxin  (LANOXIN ) 0.125 MG tablet Take 0.125 mg by mouth daily.    [provider]  diltiazem  (DILACOR XR ) 180 MG 24 hr capsule Take 180 mg by mouth daily.      [provider]  DULoxetine  (CYMBALTA ) 30 MG capsule Take 30 mg by mouth daily.    [provider]  Empagliflozin-metFORMIN HCl ER (SYNJARDY XR) 12.01-999 MG TB24 Take 1 tablet by mouth 2 (two) times daily with a meal.    [provider]  fenofibrate  160 MG tablet Take 160 mg by mouth daily.    [provider]  Glucose (TRUEPLUS GLUCOSE) 15 GM/32ML GEL Take 32 mLs by mouth as needed (for hypoglycemia).    [provider]  insulin  glargine (LANTUS ) 100 UNIT/ML injection Inject 0.1 mLs (10 Units total) into the skin at bedtime. 07/13/22   Christobal Guadalajara, MD  lamoTRIgine  (LAMICTAL ) 100 MG tablet Take 100 mg by mouth daily.    [provider]  lidocaine 4 % Place 1 patch onto the skin daily.    [provider]  melatonin 1 MG TABS tablet Take 1 mg by mouth at bedtime.    [provider]  melatonin 3 MG TABS tablet Take 3 mg by mouth at bedtime.    [provider]  methocarbamol  (ROBAXIN ) 500 MG tablet Take 750 mg by mouth 4 (four) times daily. For one month with last day on 07/26/22    [provider]  ondansetron  (ZOFRAN ) 4 MG tablet Take 4 mg by mouth every 8 (eight) hours as needed for nausea or vomiting.    [provider]  ondansetron  (ZOFRAN -ODT) 4 MG disintegrating tablet Take 1 tablet (4 mg total) by mouth every 8 (eight) hours as needed for nausea or vomiting. 4mg  ODT q4 hours prn nausea/vomit 07/25/22   Harris, Abigail, PA-C  pantoprazole  (PROTONIX ) 40 MG tablet Take 1 tablet (40 mg total) by mouth daily. 07/13/22 08/12/22  Christobal Guadalajara, MD  polyethylene glycol (MIRALAX / GLYCOLAX) 17 g packet Take 17 g by mouth daily.    [provider]  pregabalin  (LYRICA ) 100 MG capsule Take 100 mg by mouth at bedtime.    [provider]  promethazine (PHENERGAN) 25 MG/ML injection Inject 12.5 mg into the muscle every 6 (six) hours as needed for nausea or vomiting.    [provider]  senna (SENOKOT) 8.6 MG TABS tablet  Take 2 tablets by mouth daily as needed for mild constipation.    [provider]  simethicone (MYLICON) 125 MG chewable tablet Chew 125 mg by mouth 4 (four) times daily - after meals and at bedtime.    [provider]  sodium phosphate Pediatric (FLEET) 3.5-9.5 GM/59ML enema Place 1 enema rectally daily as needed for severe constipation.    [provider]  tamsulosin  (FLOMAX ) 0.4 MG CAPS capsule Take 1 capsule (0.4 mg total) by mouth daily. 07/25/22   Harris, Abigail, PA-C  warfarin (COUMADIN ) 5 MG tablet Adjustment of Coumadin  dose to maintain INR 2-3 07/13/22   Christobal Guadalajara, MD      Allergies    Eliquis [apixaban] and Oseltamivir phosphate    Review of Systems   Review of Systems A 10 point review of  systems was performed and is negative unless otherwise reported in HPI.  Physical Exam Updated Vital Signs BP 114/63   Pulse (!) 120   Temp 98.4 F (36.9 C) (Oral)   Resp 16   SpO2 90%  Physical Exam General: Normal appearing {Desc; male/male:11659}, lying in bed.  HEENT: PERRLA, Sclera anicteric, MMM, trachea midline.  Cardiology: RRR, no murmurs/rubs/gallops. BL radial and DP pulses equal bilaterally.  Resp: Normal respiratory rate and effort. CTAB, no wheezes, rhonchi, crackles.  Abd: Soft, non-tender, non-distended. No rebound tenderness or guarding.  GU: Deferred. MSK: No peripheral edema or signs of trauma. Extremities without deformity or TTP. No cyanosis or clubbing. Skin: warm, dry. No rashes or lesions. Back: No CVA tenderness Neuro: A&Ox4, CNs II-XII grossly intact. MAEs. Sensation grossly intact.  Psych: Normal mood and affect.   ED Results / Procedures / Treatments   Labs (all labs ordered are listed, but only abnormal results are displayed) Labs Reviewed  COMPREHENSIVE METABOLIC PANEL WITH GFR - Abnormal; Notable for the following components:      Result Value   CO2 20 (*)    Glucose, Bld 193 (*)    BUN 24 (*)    Calcium 8.2 (*)    Total Protein 5.6 (*)    Albumin 3.3 (*)    Alkaline Phosphatase 26 (*)    All other components within normal limits  CBC - Abnormal; Notable for the following components:   WBC 11.0 (*)    RBC 3.48 (*)    Hemoglobin 6.7 (*)    HCT 23.7 (*)    MCV 68.1 (*)    MCH 19.3 (*)    MCHC 28.3 (*)    RDW 18.6 (*)    All other components within normal limits  URINALYSIS, ROUTINE W REFLEX MICROSCOPIC  CBG MONITORING, ED  CBG MONITORING, ED  POC OCCULT BLOOD, ED  TYPE AND SCREEN  PREPARE RBC (CROSSMATCH)    EKG EKG Interpretation Date/Time:  Wednesday February 29 2024 18:58:14 EDT Ventricular Rate:  73 PR Interval:    QRS Duration:  144 QT Interval:  394 QTC Calculation: 435 R Axis:   41  Text Interpretation: Atrial  fibrillation Right bundle branch block Confirmed by Franklyn Gills [phone]) on 02/29/2024 8:07:58 PM  Radiology No results found.  Procedures Procedures  {Document cardiac monitor, telemetry assessment procedure when appropriate:1}  Medications Ordered in ED Medications  0.9 %  sodium chloride  infusion (Manually program via Guardrails IV Fluids) (has no administration in time range)    ED Course/ Medical Decision Making/ A&P  Medical Decision Making Amount and/or Complexity of Data Reviewed Labs: ordered. Decision-making details documented in ED Course.  Risk Prescription drug management.    This patient presents to the ED for concern of ***, this involves an extensive number of treatment options, and is a complaint that carries with it a high risk of complications and morbidity.  I considered the following differential and admission for this acute, potentially life threatening condition.   MDM:    ***  Clinical Course as of 02/29/24 2012  Wed Feb 29, 2024  2007 Hemoglobin(!!): 6.7 [HN]    Clinical Course User Index [HN] Franklyn Sid SAILOR, MD    Labs: I Ordered, and personally interpreted labs.  The pertinent results include:  ***  Imaging Studies ordered: I ordered imaging studies including *** I independently visualized and interpreted imaging. I agree with the radiologist interpretation  Additional history obtained from ***.  External records from outside source obtained and reviewed including ***  Cardiac Monitoring: .The patient was maintained on a cardiac monitor.  I personally viewed and interpreted the cardiac monitored which showed an underlying rhythm of: ***  Reevaluation: After the interventions noted above, I reevaluated the patient and found that they have :{resolved/improved/worsened:23923::improved}  Social Determinants of Health: .***  Disposition:  ***  Co morbidities that complicate the patient evaluation . Past  Medical History:  Diagnosis Date  . Atrial fibrillation (HCC)    chronic  . Carcinoid tumor   . Carotid artery dissection (HCC)    History of previous spontaneous carotid artery dissection  . Diabetes mellitus   . Hyperlipidemia   . S/P small bowel resection    History of a partial bowel resection for a carcinoid  . Seizure disorder (HCC)      Medicines Meds ordered this encounter  Medications  . 0.9 %  sodium chloride  infusion (Manually program via Guardrails IV Fluids)    I have reviewed the patients home medicines and have made adjustments as needed  Problem List / ED Course: Problem List Items Addressed This Visit   None        {Document critical care time when appropriate:1} {Document review of labs and clinical decision tools ie heart score, Chads2Vasc2 etc:1}  {Document your independent review of radiology images, and any outside records:1} {Document your discussion with family members, caretakers, and with consultants:1} {Document social determinants of health affecting pt's care:1} {Document your decision making why or why not admission, treatments were needed:1}  This note was created using dictation software, which may contain spelling or grammatical errors.

## 2024-02-29 NOTE — ED Provider Triage Note (Addendum)
 Emergency Medicine Provider Triage Evaluation Note  Mitchell Bright , a 73 y.o. male  was evaluated in triage.  Pt complains of syncopal event.  Patient was walking into a restaurant this evening when he suddenly became diaphoretic and some patrons of the restaurant were able to help lower him into a booth, he did lose consciousness briefly and became pale and clammy according to his wife.  This resolved quickly.  He has a history of A-fib and is on warfarin, digoxin , Cardizem .  Has noticed some dark stools over the last day or so.  Denies chest pain, shortness of breath, abdominal pain.  No recent illness.  Patient traveled last weekend to [place]  for a wedding.  Review of Systems  Positive: As above Negative: As above  Physical Exam  BP 114/63   Pulse (!) 120   Temp 98.4 F (36.9 C) (Oral)   Resp 16   SpO2 90%  Gen:   Awake, no distress   Resp:  Normal effort, lungs are clear to auscultation in all fields MSK:   Moves extremities without difficulty  Other:  Alert and oriented x 3, no facial droop or asymmetry, in A-fib on auscultation, no abdominal tenderness  Medical Decision Making  Medically screening exam initiated at 7:21 PM.  Appropriate orders placed.  Jenel ORN Dejarnette was informed that the remainder of the evaluation will be completed by another provider, this initial triage assessment does not replace that evaluation, and the importance of remaining in the ED until their evaluation is complete.     Glendia Rocky SAILOR, PA-C 02/29/24 1924    Glendia Rocky SAILOR, [place] 02/29/24 1947

## 2024-02-29 NOTE — ED Triage Notes (Addendum)
 Pt arrives via via EMS. Pt was walking into a restaurant and had a sudden onset of dizziness, diaphoresis, and generalized weakness. At that time, pt was assisted to a booth when he passed out. Pt arrives AxOx4. Pt reports recent flight to [place]. Denies cp or sob. Pt does take coumadin . Denies any injury from the syncopal episode.

## 2024-03-01 ENCOUNTER — Other Ambulatory Visit: Payer: Self-pay

## 2024-03-01 DIAGNOSIS — E1142 Type 2 diabetes mellitus with diabetic polyneuropathy: Secondary | ICD-10-CM | POA: Diagnosis present

## 2024-03-01 DIAGNOSIS — K922 Gastrointestinal hemorrhage, unspecified: Secondary | ICD-10-CM

## 2024-03-01 DIAGNOSIS — E7800 Pure hypercholesterolemia, unspecified: Secondary | ICD-10-CM | POA: Diagnosis present

## 2024-03-01 DIAGNOSIS — R791 Abnormal coagulation profile: Secondary | ICD-10-CM | POA: Diagnosis present

## 2024-03-01 DIAGNOSIS — G40909 Epilepsy, unspecified, not intractable, without status epilepticus: Secondary | ICD-10-CM | POA: Diagnosis present

## 2024-03-01 DIAGNOSIS — K2970 Gastritis, unspecified, without bleeding: Secondary | ICD-10-CM | POA: Diagnosis present

## 2024-03-01 DIAGNOSIS — I480 Paroxysmal atrial fibrillation: Secondary | ICD-10-CM | POA: Diagnosis present

## 2024-03-01 DIAGNOSIS — E1165 Type 2 diabetes mellitus with hyperglycemia: Secondary | ICD-10-CM | POA: Diagnosis present

## 2024-03-01 DIAGNOSIS — Z79899 Other long term (current) drug therapy: Secondary | ICD-10-CM | POA: Diagnosis not present

## 2024-03-01 DIAGNOSIS — E441 Mild protein-calorie malnutrition: Secondary | ICD-10-CM | POA: Diagnosis present

## 2024-03-01 DIAGNOSIS — Z794 Long term (current) use of insulin: Secondary | ICD-10-CM | POA: Diagnosis not present

## 2024-03-01 DIAGNOSIS — K920 Hematemesis: Secondary | ICD-10-CM | POA: Diagnosis not present

## 2024-03-01 DIAGNOSIS — I10 Essential (primary) hypertension: Secondary | ICD-10-CM | POA: Diagnosis present

## 2024-03-01 DIAGNOSIS — K284 Chronic or unspecified gastrojejunal ulcer with hemorrhage: Secondary | ICD-10-CM | POA: Diagnosis present

## 2024-03-01 DIAGNOSIS — Z9049 Acquired absence of other specified parts of digestive tract: Secondary | ICD-10-CM | POA: Diagnosis not present

## 2024-03-01 DIAGNOSIS — K640 First degree hemorrhoids: Secondary | ICD-10-CM | POA: Diagnosis present

## 2024-03-01 DIAGNOSIS — D62 Acute posthemorrhagic anemia: Secondary | ICD-10-CM | POA: Diagnosis present

## 2024-03-01 DIAGNOSIS — D500 Iron deficiency anemia secondary to blood loss (chronic): Secondary | ICD-10-CM | POA: Diagnosis present

## 2024-03-01 DIAGNOSIS — I4891 Unspecified atrial fibrillation: Secondary | ICD-10-CM | POA: Diagnosis not present

## 2024-03-01 DIAGNOSIS — Z7901 Long term (current) use of anticoagulants: Secondary | ICD-10-CM | POA: Diagnosis not present

## 2024-03-01 DIAGNOSIS — R55 Syncope and collapse: Secondary | ICD-10-CM | POA: Diagnosis present

## 2024-03-01 DIAGNOSIS — I482 Chronic atrial fibrillation, unspecified: Secondary | ICD-10-CM | POA: Diagnosis present

## 2024-03-01 LAB — GLUCOSE, CAPILLARY
Glucose-Capillary: 214 mg/dL — ABNORMAL HIGH (ref 70–99)
Glucose-Capillary: 84 mg/dL (ref 70–99)
Glucose-Capillary: 135 mg/dL — ABNORMAL HIGH (ref 70–99)

## 2024-03-01 LAB — CBC WITH DIFFERENTIAL/PLATELET
Abs Immature Granulocytes: 0.02 10*3/uL (ref 0.00–0.07)
Basophils Absolute: 0 10*3/uL (ref 0.0–0.1)
Basophils Relative: 1 %
Eosinophils Absolute: 0 10*3/uL (ref 0.0–0.5)
Eosinophils Relative: 1 %
HCT: 24.3 % — ABNORMAL LOW (ref 39.0–52.0)
Hemoglobin: 7.1 g/dL — ABNORMAL LOW (ref 13.0–17.0)
Immature Granulocytes: 0 %
Lymphocytes Relative: 28 %
Lymphs Abs: 2 10*3/uL (ref 0.7–4.0)
MCH: 20.5 pg — ABNORMAL LOW (ref 26.0–34.0)
MCHC: 29.2 g/dL — ABNORMAL LOW (ref 30.0–36.0)
MCV: 70.2 fL — ABNORMAL LOW (ref 80.0–100.0)
Monocytes Absolute: 0.7 10*3/uL (ref 0.1–1.0)
Monocytes Relative: 10 %
Neutro Abs: 4.4 10*3/uL (ref 1.7–7.7)
Neutrophils Relative %: 60 %
Platelets: 233 10*3/uL (ref 150–400)
RBC: 3.46 MIL/uL — ABNORMAL LOW (ref 4.22–5.81)
RDW: 19.1 % — ABNORMAL HIGH (ref 11.5–15.5)
WBC: 7.3 10*3/uL (ref 4.0–10.5)
nRBC: 0 % (ref 0.0–0.2)

## 2024-03-01 LAB — COMPREHENSIVE METABOLIC PANEL WITH GFR
ALT: 12 U/L (ref 0–44)
AST: 16 U/L (ref 15–41)
Albumin: 3.4 g/dL — ABNORMAL LOW (ref 3.5–5.0)
Alkaline Phosphatase: 29 U/L — ABNORMAL LOW (ref 38–126)
Anion gap: 10 (ref 5–15)
BUN: 22 mg/dL (ref 8–23)
CO2: 22 mmol/L (ref 22–32)
Calcium: 8.6 mg/dL — ABNORMAL LOW (ref 8.9–10.3)
Chloride: 103 mmol/L (ref 98–111)
Creatinine, Ser: 0.66 mg/dL (ref 0.61–1.24)
GFR, Estimated: >60 mL/min (ref >60)
Glucose, Bld: 73 mg/dL (ref 70–99)
Potassium: 3.5 mmol/L (ref 3.5–5.1)
Sodium: 135 mmol/L (ref 135–145)
Total Bilirubin: 0.5 mg/dL (ref 0.0–1.2)
Total Protein: 5.6 g/dL — ABNORMAL LOW (ref 6.5–8.1)

## 2024-03-01 LAB — PROTIME-INR
INR: 9.4 (ref 0.8–1.2)
INR: 2.5 — ABNORMAL HIGH (ref 0.8–1.2)
Prothrombin Time: 79.1 s — ABNORMAL HIGH (ref 11.4–15.2)
Prothrombin Time: 27.9 s — ABNORMAL HIGH (ref 11.4–15.2)

## 2024-03-01 LAB — PREPARE RBC (CROSSMATCH)

## 2024-03-01 LAB — MRSA NEXT GEN BY PCR, NASAL: MRSA by PCR Next Gen: NOT DETECTED

## 2024-03-01 LAB — TYPE AND SCREEN

## 2024-03-01 LAB — HEMOGLOBIN AND HEMATOCRIT, BLOOD
HCT: 23.3 % — ABNORMAL LOW (ref 39.0–52.0)
HCT: 25 % — ABNORMAL LOW (ref 39.0–52.0)
HCT: 27.7 % — ABNORMAL LOW (ref 39.0–52.0)
Hemoglobin: 6.7 g/dL — CL (ref 13.0–17.0)
Hemoglobin: 7.4 g/dL — ABNORMAL LOW (ref 13.0–17.0)
Hemoglobin: 8.2 g/dL — ABNORMAL LOW (ref 13.0–17.0)

## 2024-03-01 LAB — IRON AND TIBC
Iron: 23 ug/dL — ABNORMAL LOW (ref 45–182)
Saturation Ratios: 4 % — ABNORMAL LOW (ref 17.9–39.5)
TIBC: 521 ug/dL — ABNORMAL HIGH (ref 250–450)
UIBC: 498 ug/dL

## 2024-03-01 LAB — FERRITIN: Ferritin: 3 ng/mL — ABNORMAL LOW (ref 24–336)

## 2024-03-01 LAB — MAGNESIUM: Magnesium: 2.1 mg/dL (ref 1.7–2.4)

## 2024-03-01 MED ORDER — DILTIAZEM HCL ER COATED BEADS 180 MG PO CP24
180.0000 mg | ORAL_CAPSULE | Freq: Every day | ORAL | Status: DC
  Administered 2024-03-01 – 2024-03-04 (×4): 180 mg via ORAL
  Filled 2024-03-01 (×4): qty 1

## 2024-03-01 MED ORDER — FENOFIBRATE 160 MG PO TABS
160.0000 mg | ORAL_TABLET | Freq: Every day | ORAL | Status: DC
  Administered 2024-03-02 – 2024-03-04 (×3): 160 mg via ORAL
  Filled 2024-03-01 (×3): qty 1

## 2024-03-01 MED ORDER — SODIUM CHLORIDE 0.9% IV SOLUTION: Freq: Once | INTRAVENOUS | Status: AC

## 2024-03-01 MED ORDER — ACETAMINOPHEN 325 MG PO TABS
650.0000 mg | ORAL_TABLET | Freq: Four times a day (QID) | ORAL | Status: DC | PRN
Start: 2024-03-01 — End: 2024-03-04

## 2024-03-01 MED ORDER — PREGABALIN 75 MG PO CAPS
100.0000 mg | ORAL_CAPSULE | Freq: Every day | ORAL | Status: DC
  Administered 2024-03-01 – 2024-03-03 (×3): 100 mg via ORAL
  Filled 2024-03-01 (×3): qty 1

## 2024-03-01 MED ORDER — CHLORHEXIDINE GLUCONATE CLOTH 2 % EX PADS
6.0000 | MEDICATED_PAD | Freq: Every day | CUTANEOUS | Status: DC
  Administered 2024-03-01 – 2024-03-03 (×4): 6 via TOPICAL

## 2024-03-01 MED ORDER — INSULIN ASPART 100 UNIT/ML IJ SOLN
0.0000 [IU] | Freq: Three times a day (TID) | INTRAMUSCULAR | Status: DC
  Administered 2024-03-01: 3 [IU] via SUBCUTANEOUS
  Administered 2024-03-02: 1 [IU] via SUBCUTANEOUS
  Administered 2024-03-03: 3 [IU] via SUBCUTANEOUS
  Administered 2024-03-03: 5 [IU] via SUBCUTANEOUS
  Administered 2024-03-04: 1 [IU] via SUBCUTANEOUS
  Filled 2024-03-01: qty 0.09

## 2024-03-01 MED ORDER — ACETAMINOPHEN 650 MG RE SUPP: 650.0000 mg | Freq: Four times a day (QID) | RECTAL | Status: DC | PRN

## 2024-03-01 MED ORDER — SODIUM CHLORIDE 0.9 % IV SOLN: INTRAVENOUS | Status: DC

## 2024-03-01 MED ORDER — TAMSULOSIN HCL 0.4 MG PO CAPS
0.4000 mg | ORAL_CAPSULE | Freq: Every day | ORAL | Status: DC
  Administered 2024-03-01 – 2024-03-04 (×4): 0.4 mg via ORAL
  Filled 2024-03-01 (×4): qty 1

## 2024-03-01 MED ORDER — FINASTERIDE 5 MG PO TABS
5.0000 mg | ORAL_TABLET | Freq: Every day | ORAL | Status: DC
  Administered 2024-03-01 – 2024-03-04 (×4): 5 mg via ORAL
  Filled 2024-03-01 (×4): qty 1

## 2024-03-01 MED ORDER — ORAL CARE MOUTH RINSE
15.0000 mL | OROMUCOSAL | Status: DC | PRN
Start: 2024-03-01 — End: 2024-03-04

## 2024-03-01 MED ORDER — DIGOXIN 125 MCG PO TABS
0.1250 mg | ORAL_TABLET | Freq: Every day | ORAL | Status: DC
  Administered 2024-03-01 – 2024-03-04 (×4): 0.125 mg via ORAL
  Filled 2024-03-01 (×4): qty 1

## 2024-03-01 MED ORDER — DILTIAZEM HCL ER BEADS 180 MG PO CP24: 180.0000 mg | ORAL_CAPSULE | Freq: Every day | ORAL | Status: DC

## 2024-03-01 MED ORDER — ONDANSETRON HCL 4 MG/2ML IJ SOLN: 4.0000 mg | Freq: Four times a day (QID) | INTRAMUSCULAR | Status: DC | PRN

## 2024-03-01 MED ORDER — DULOXETINE HCL 30 MG PO CPEP
60.0000 mg | ORAL_CAPSULE | Freq: Two times a day (BID) | ORAL | Status: DC
  Administered 2024-03-01 – 2024-03-04 (×6): 60 mg via ORAL
  Filled 2024-03-01 (×6): qty 2

## 2024-03-01 MED ORDER — LAMOTRIGINE ER 100 MG PO TB24
100.0000 mg | ORAL_TABLET | Freq: Every day | ORAL | Status: DC
  Administered 2024-03-01 – 2024-03-04 (×4): 100 mg via ORAL
  Filled 2024-03-01 (×4): qty 1

## 2024-03-01 MED ORDER — INSULIN GLARGINE-YFGN 100 UNIT/ML ~~LOC~~ SOLN
20.0000 [IU] | Freq: Every day | SUBCUTANEOUS | Status: DC
  Administered 2024-03-01 – 2024-03-04 (×4): 20 [IU] via SUBCUTANEOUS
  Filled 2024-03-01 (×5): qty 0.2

## 2024-03-01 NOTE — Consult Note (Signed)
 Referring Provider: Dr. Celinda Primary Care Physician:  Radiontchenko, Alexei, MD Primary Gastroenterologist:  Sampson  Reason for Consultation:  Melena  HPI: Mitchell Bright is a 73 y.o. male with 1 week history of black tarry stools in the setting of Coumadin .  Denies abdominal pain nausea vomiting or hematochezia.  Got lightheaded and had a near syncopal episode prior to coming to the hospital.  INR > 10. Hgb 6.7. Reports having a diverticular bleed years ago and had a subsequent sigmoid colectomy due to that in 2005.  Last colonoscopy in September 2024 that showed a normal-appearing colocolonic anastomosis in the sigmoid colon ([facility]).  EGD in the near future was planned to evaluate his iron deficiency anemia and history of NSAID use per outpatient GI note reviewed from May.  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  He takes Excedrin 3 times per week for headaches.  Denies alcohol use.  Past Medical History:  Diagnosis Date   Atrial fibrillation [facility])    chronic   Carcinoid tumor    Carotid artery dissection (HCC)    History of previous spontaneous carotid artery dissection   Diabetes mellitus    Hyperlipidemia    S/P small bowel resection    History of a partial bowel resection for a carcinoid   Seizure disorder [facility])     Past Surgical History:  Procedure Laterality Date   PARTIAL COLECTOMY     left, sigmoid colectomy by Dr. Curvin in 2005   SMALL INTESTINE SURGERY      Prior to Admission medications   Medication Sig Start Date End Date Taking? Authorizing Provider  digoxin  (LANOXIN ) 0.125 MG tablet Take 0.125 mg by mouth daily.   Yes [provider]  diltiazem  (TIAZAC ) 180 MG 24 hr capsule Take 180 mg by mouth daily.   Yes [provider]  DULoxetine  (CYMBALTA ) 60 MG capsule Take 60 mg by mouth 2 (two) times daily.   Yes [provider]  EXCEDRIN MIGRAINE 250-250-65 MG tablet Take 1 tablet by mouth every 6  (six) hours as needed (for headaches).   Yes [provider]  fenofibrate  160 MG tablet Take 160 mg by mouth daily.   Yes [provider]  finasteride (PROSCAR) 5 MG tablet Take 5 mg by mouth daily.   Yes [provider]  fluticasone (FLONASE) 50 MCG/ACT nasal spray Place 1 spray into both nostrils 2 (two) times daily as needed for allergies or rhinitis.   Yes [provider]  icosapent Ethyl (VASCEPA) 1 g capsule Take 1 g by mouth 2 (two) times daily.   Yes [provider]  lamoTRIgine  (LAMICTAL  XR) 100 MG 24 hour tablet Take 100 mg by mouth daily.   Yes [provider]  LANTUS  SOLOSTAR 100 UNIT/ML Solostar Pen Inject 25 Units into the skin in the morning.   Yes [provider]  Melatonin 1 MG SUBL Place 1.5 mg under the tongue at bedtime as needed (for sleep).   Yes [provider]  polyethylene glycol (MIRALAX / GLYCOLAX) 17 g packet Take 17 g by mouth daily as needed for mild constipation.   Yes [provider]  pregabalin  (LYRICA ) 100 MG capsule Take 100 mg by mouth at bedtime.   Yes [provider]  ramipril (ALTACE) 5 MG capsule Take 5 mg by mouth daily.   Yes [provider]  SENNA S 8.6-50 MG tablet Take 2 tablets by mouth in the morning.   Yes [provider]  SYNJARDY 01-999 MG TABS Take 1 tablet by mouth in the morning and at bedtime.   Yes [provider]  tamsulosin  (FLOMAX ) 0.4 MG CAPS capsule Take 1 capsule (0.4 mg total) by mouth daily. 07/25/22  Yes Harris, Abigail, PA-C  TYLENOL  500 MG tablet Take 500 mg by mouth every 6 (six) hours as needed (for headaches).   Yes [provider]  warfarin (COUMADIN ) 5 MG tablet Adjustment of Coumadin  dose to maintain INR 2-3 Patient taking differently: Take 5 mg by mouth See admin instructions. Take 5 mg by mouth in the morning on only Tuesdays and Thursdays 07/13/22  Yes Kc, Mennie, MD  warfarin (COUMADIN ) 7.5 MG tablet  Take 7.5 mg by mouth See admin instructions. Take 7.5 mg by mouth in the morning only on Sun/Mon/Wed/Fri/Sat   Yes [provider]  Glucose (TRUEPLUS GLUCOSE) 15 GM/32ML GEL Take 32 mLs by mouth as needed (for hypoglycemia).    [provider]  insulin  glargine (LANTUS ) 100 UNIT/ML injection Inject 0.1 mLs (10 Units total) into the skin at bedtime. Patient not taking: Reported on 02/29/2024 07/13/22   Christobal Mennie, MD  ondansetron  (ZOFRAN -ODT) 4 MG disintegrating tablet Take 1 tablet (4 mg total) by mouth every 8 (eight) hours as needed for nausea or vomiting. 4mg  ODT q4 hours prn nausea/vomit Patient not taking: Reported on 02/29/2024 07/25/22   Harris, Abigail, PA-C  pantoprazole  (PROTONIX ) 40 MG tablet Take 1 tablet (40 mg total) by mouth daily. Patient not taking: Reported on 02/29/2024 07/13/22 02/29/24  Christobal Mennie, MD    Scheduled Meds:  sodium chloride    Intravenous Once   pantoprazole  (PROTONIX ) IV  40 mg Intravenous Q12H   Continuous Infusions: PRN Meds:.acetaminophen  **OR** acetaminophen , ondansetron  (ZOFRAN ) IV  Allergies as of 02/29/2024 - Review Complete 02/29/2024  Allergen Reaction Noted   Apixaban Nausea And Vomiting and Other (See Comments) 04/21/2016   Oseltamivir phosphate Palpitations and Other (See Comments) 08/19/2009    History reviewed. No pertinent family history.  Social History   Socioeconomic History   Marital status: Married    Spouse name: Not on file   Number of children: Not on file   Years of education: Not on file   Highest education level: Not on file  Occupational History   Not on file  Tobacco Use   Smoking status: Never   Smokeless tobacco: Never  Vaping Use   Vaping status: Never Used  Substance and Sexual Activity   Alcohol use: Yes    Comment: rarely   Drug use: Not on file   Sexual activity: Not on file  Other Topics Concern   Not on file  Social History Narrative   Not on file   Social Drivers of Health    Financial Resource Strain: Low Risk  (10/23/2023)   Received from [facility]   Overall Financial Resource Strain (CARDIA)    Difficulty of Paying Living Expenses: Not hard at all  Food Insecurity: No Food Insecurity (10/23/2023)   Received from [facility]   Hunger Vital Sign    Within the past 12 months, you worried that your food would run out before you got the money to buy more.: Never true    Within the past 12 months, the food you bought just didn't last and you didn't have money to get more.: Never true  Transportation Needs: No Transportation Needs (10/23/2023)   Received from [facility] - Transportation    Lack of Transportation (Medical):  No    Lack of Transportation (Non-Medical): No  Physical Activity: Sufficiently Active (10/23/2023)   Received from [facility]   Exercise Vital Sign    On average, how many days per week do you engage in moderate to strenuous exercise (like a brisk walk)?: 4 days    On average, how many minutes do you engage in exercise at this level?: 40 min  Stress: No Stress Concern Present (10/23/2023)   Received from [facility] - Occupational Stress Questionnaire    Feeling of Stress : Only a little  Social Connections: Socially Integrated (10/23/2023)   Received from [facility]   Social Network    How would you rate your social network (family, work, friends)?: Good participation with social networks  Intimate Partner Violence: Not At Risk (10/23/2023)   Received from [facility]   HITS    Over the last 12 months how often did your partner physically hurt you?: Never    Over the last 12 months how often did your partner insult you or talk down to you?: Rarely    Over the last 12 months how often did your partner threaten you with physical harm?: Never    Over the last 12 months how often did your partner scream or curse at you?: Never    Review of Systems: All negative except as  stated above in HPI.  Physical Exam: Vital signs: Vitals:   03/01/24 0601 03/01/24 0649  BP:    Pulse: 64   Resp:    Temp:  (!) 97.3 F (36.3 C)  SpO2:    BP 121/74   General:   Alert,  Well-developed, well-nourished, pleasant and cooperative in NAD Head: normocephalic, atraumatic Eyes: anicteric sclera ENT: oropharynx clear Neck: supple, nontender Lungs:  Clear throughout to auscultation.   No wheezes, crackles, or rhonchi. No acute distress. Heart:  Regular rate and rhythm; no murmurs, clicks, rubs,  or gallops. Abdomen: soft, nontender, nondistended, +BS  Rectal:  Deferred Ext: no edema  GI:  Lab Results: Recent Labs    02/29/24 1918 03/01/24 0142 03/01/24 0550  WBC 11.0*  --  7.3  HGB 6.7* 6.7* 7.1*  HCT 23.7* 23.3* 24.3*  PLT 274  --  233   BMET Recent Labs    02/29/24 1918 03/01/24 0550  NA 135 135  K 4.4 3.5  CL 105 103  CO2 20* 22  GLUCOSE 193* 73  BUN 24* 22  CREATININE 0.78 0.66  CALCIUM 8.2* 8.6*   LFT Recent Labs    03/01/24 0550  PROT 5.6*  ALBUMIN 3.4*  AST 16  ALT 12  ALKPHOS 29*  BILITOT 0.5   PT/INR Recent Labs    02/29/24 2052 03/01/24 0142  LABPROT >90.0* 79.1*  INR >10.0* 9.4*     Studies/Results:   Impression/Plan: Melena in the setting of a supratherapeutic INR > 10 concerning for mucosal bleeding versus peptic ulcer disease.  Coumadin  on hold and after correction of INR will need an upper endoscopy this admission to further evaluate for peptic ulcer disease.  Continue IV PPI Q 12 hours. Will tentatively plan for EGD to be done tomorrow if INR is corrected.  Clear liquid diet.  N.p.o. after midnight.    LOS: 0 days   Jerrell JAYSON Sol  03/01/2024, 9:16 AM  Questions please call [phone]

## 2024-03-01 NOTE — ED Notes (Signed)
 ED TO INPATIENT HANDOFF REPORT  Name/Age/Gender Mitchell Bright 73 y.o. male  Code Status    Code Status Orders  (From admission, onward)           Start     Ordered   03/01/24 0203  Full code  Continuous       Question:  By:  Answer:  Consent: discussion documented in EHR   03/01/24 0203           Code Status History     Date Active Date Inactive Code Status Order ID Comments User Context   07/08/2022 1635 07/13/2022 1558 Full Code [id number]  Celinda Alm Lot, MD ED       Home/SNF/Other Home  Chief Complaint Acute lower GI bleeding [K92.2]  Level of Care/Admitting Diagnosis ED Disposition     ED Disposition  Admit   Condition  --   Comment  Hospital Area: [facility] [[id number]]  Level of Care: Stepdown [14]  Admit to SDU based on following criteria: Severe physiological/psychological symptoms:  Any diagnosis requiring assessment & intervention at least every 4 hours on an ongoing basis to obtain desired patient outcomes including stability and rehabilitation  May place patient in observation at [facility] or [facility] if equivalent level of care is available:: No  Covid Evaluation: Asymptomatic - no recent exposure (last 10 days) testing not required  Diagnosis: Acute lower GI bleeding [761822]  Admitting Physician: HOWERTER, JUSTIN B [[id number]]  Attending Physician: HOWERTER, JUSTIN B [[id number]]          Medical History Past Medical History:  Diagnosis Date   Atrial fibrillation (HCC)    chronic   Carcinoid tumor    Carotid artery dissection (HCC)    History of previous spontaneous carotid artery dissection   Diabetes mellitus    Hyperlipidemia    S/P small bowel resection    History of a partial bowel resection for a carcinoid   Seizure disorder (HCC)     Allergies Allergies  Allergen Reactions   Apixaban Nausea And Vomiting and Other (See Comments)    GI Bleeding also   Oseltamivir Phosphate Palpitations and Other  (See Comments)    Drowsiness, also     IV Location/Drains/Wounds Patient Lines/Drains/Airways Status     Active Line/Drains/Airways     Name Placement date Placement time Site Days   Peripheral IV 02/29/24 20 G Left Antecubital 02/29/24  1844  Antecubital  1   Peripheral IV 02/29/24 Anterior;Right Forearm 02/29/24  2332  Forearm  1   PICC Double Lumen 07/08/22 Left Basilic 07/08/22  1409  -- 602            Labs/Imaging Results for orders placed or performed during the hospital encounter of 02/29/24 (from the past 48 hours)  Comprehensive metabolic panel     Status: Abnormal   Collection Time: 02/29/24  7:18 PM  Result Value Ref Range   Sodium 135 135 - 145 mmol/L   Potassium 4.4 3.5 - 5.1 mmol/L   Chloride 105 98 - 111 mmol/L   CO2 20 (L) 22 - 32 mmol/L   Glucose, Bld 193 (H) 70 - 99 mg/dL    Comment: Glucose reference range applies only to samples taken after fasting for at least 8 hours.   BUN 24 (H) 8 - 23 mg/dL   Creatinine, Ser 9.21 0.61 - 1.24 mg/dL   Calcium 8.2 (L) 8.9 - 10.3 mg/dL   Total Protein 5.6 (L) 6.5 - 8.1 g/dL  Albumin 3.3 (L) 3.5 - 5.0 g/dL   AST 23 15 - 41 U/L   ALT 13 0 - 44 U/L   Alkaline Phosphatase 26 (L) 38 - 126 U/L   Total Bilirubin 0.8 0.0 - 1.2 mg/dL   GFR, Estimated >39 >39 mL/min    Comment: (NOTE) Calculated using the CKD-EPI Creatinine Equation (2021)    Anion gap 10 5 - 15    Comment: Performed at [facility], [address] [place], [place] [address]  CBC     Status: Abnormal   Collection Time: 02/29/24  7:18 PM  Result Value Ref Range   WBC 11.0 (H) 4.0 - 10.5 K/uL   RBC 3.48 (L) 4.22 - 5.81 MIL/uL   Hemoglobin 6.7 (LL) 13.0 - 17.0 g/dL    Comment: REPEATED TO VERIFY Reticulocyte Hemoglobin testing may be clinically indicated, consider ordering this additional test [id number] THIS CRITICAL RESULT HAS VERIFIED AND BEEN CALLED TO I.FOLEY,PARAMEDIC BY ATCHISON,MARY ON 06 25 2025 AT 1945, AND HAS BEEN READ  BACK.     HCT 23.7 (L) 39.0 - 52.0 %   MCV 68.1 (L) 80.0 - 100.0 fL   MCH 19.3 (L) 26.0 - 34.0 pg   MCHC 28.3 (L) 30.0 - 36.0 g/dL   RDW 81.3 (H) 88.4 - 84.4 %   Platelets 274 150 - 400 K/uL   nRBC 0.0 0.0 - 0.2 %    Comment: Performed at [facility], [address], [place], [place] [address]  ABO/Rh     Status: None   Collection Time: 02/29/24  7:18 PM  Result Value Ref Range   ABO/RH(D)      O POS Performed at [facility], [address] [place], [place] [address]   Type and screen [facility]     Status: None (Preliminary result)   Collection Time: 02/29/24  8:52 PM  Result Value Ref Range   ABO/RH(D) O POS    Antibody Screen NEG    Sample Expiration      03/03/2024,2359 Performed at [facility], [address], [place], [place] [address]    Unit Number [id number]    Blood Component Type RED CELLS,LR    Unit division 00    Status of Unit ALLOCATED    Transfusion Status OK TO TRANSFUSE    Crossmatch Result Compatible    Unit Number [id number]    Blood Component Type RED CELLS,LR    Unit division 00    Status of Unit ISSUED,FINAL    Transfusion Status OK TO TRANSFUSE    Crossmatch Result Compatible    Unit Number [id number]    Blood Component Type RED CELLS,LR    Unit division 00    Status of Unit ALLOCATED    Transfusion Status OK TO TRANSFUSE    Crossmatch Result Compatible    Unit Number [id number]    Blood Component Type RED CELLS,LR    Unit division 00    Status of Unit ALLOCATED    Transfusion Status OK TO TRANSFUSE    Crossmatch Result Compatible   Prepare RBC (crossmatch)     Status: None   Collection Time: 02/29/24  8:52 PM  Result Value Ref Range   Order Confirmation      ORDER PROCESSED BY BLOOD BANK Performed at [facility], [address], [place], [place] [address]   Protime-INR     Status: Abnormal   Collection Time: 02/29/24  8:52  PM  Result Value Ref Range   Prothrombin Time >90.0 (H) 11.4 - 15.2 seconds    Comment: CRITICAL RESULT CALLED TO, READ BACK BY AND VERIFIED WITH: DIGGS, J. RN AT 2234 ON 6.25.25. FA    INR >10.0 (HH) 0.8 - 1.2    Comment: CRITICAL RESULT CALLED TO, READ BACK BY AND VERIFIED WITH: DIGGS, J. RN AT 2234 ON 6.25.25. FA (NOTE) INR goal varies based on device and disease states. Performed at [facility], [address], [place], [place] [address]   APTT     Status: Abnormal   Collection Time: 02/29/24  8:52 PM  Result Value Ref Range   aPTT 64 (H) 24 - 36 seconds    Comment:        IF BASELINE aPTT IS ELEVATED, SUGGEST PATIENT RISK ASSESSMENT BE USED TO DETERMINE APPROPRIATE ANTICOAGULANT THERAPY. Performed at [facility], [address], [place], [place] [address]   CBG monitoring, ED     Status: Abnormal   Collection Time: 02/29/24  8:57 PM  Result Value Ref Range   Glucose-Capillary 222 (H) 70 - 99 mg/dL    Comment: Glucose reference range applies only to samples taken after fasting for at least 8 hours.  Urinalysis, Routine w reflex microscopic -Urine, Clean Catch     Status: Abnormal   Collection Time: 02/29/24  9:40 PM  Result Value Ref Range   Color, Urine STRAW (A) YELLOW   APPearance CLEAR CLEAR   Specific Gravity, Urine 1.025 1.005 - 1.030   pH 5.0 5.0 - 8.0   Glucose, UA >=500 (A) NEGATIVE mg/dL   Hgb urine dipstick NEGATIVE NEGATIVE   Bilirubin Urine NEGATIVE NEGATIVE   Ketones, ur 5 (A) NEGATIVE mg/dL   Protein, ur NEGATIVE NEGATIVE mg/dL   Nitrite NEGATIVE NEGATIVE   Leukocytes,Ua NEGATIVE NEGATIVE   RBC / HPF 0-5 0 - 5 RBC/hpf   WBC, UA 0-5 0 - 5 WBC/hpf   Bacteria, UA NONE SEEN NONE SEEN   Squamous Epithelial / HPF 0-5 0 - 5 /HPF    Comment: Performed at [facility], [address] [place], [place] [address]  POC occult blood, ED RN will collect     Status: Abnormal   Collection Time: 02/29/24   9:41 PM  Result Value Ref Range   Fecal Occult Bld POSITIVE (A) NEGATIVE  Protime-INR     Status: Abnormal   Collection Time: 03/01/24  1:42 AM  Result Value Ref Range   Prothrombin Time 79.1 (H) 11.4 - 15.2 seconds   INR 9.4 (HH) 0.8 - 1.2    Comment: CRITICAL RESULT CALLED TO, READ BACK BY AND VERIFIED WITH: DIGGS, M. RN AT 9776 ON 6.26.25. FA (NOTE) INR goal varies based on device and disease states. Performed at [facility], [address] [place], [place] [address]   Hemoglobin and hematocrit, blood     Status: Abnormal   Collection Time: 03/01/24  1:42 AM  Result Value Ref Range   Hemoglobin 6.7 (LL) 13.0 - 17.0 g/dL    Comment: CRITICAL VALUE NOTED.  VALUE IS CONSISTENT WITH PREVIOUSLY REPORTED AND CALLED VALUE. REPEATED TO VERIFY    HCT 23.3 (L) 39.0 - 52.0 %    Comment: Performed at [facility], [address], [place], [place] [address]  CBC with Differential/Platelet     Status: Abnormal   Collection Time: 03/01/24  5:50 AM  Result Value Ref Range   WBC 7.3  4.0 - 10.5 K/uL   RBC 3.46 (L) 4.22 - 5.81 MIL/uL   Hemoglobin 7.1 (L) 13.0 - 17.0 g/dL    Comment: Reticulocyte Hemoglobin testing may be clinically indicated, consider ordering this additional test [id number]    HCT 24.3 (L) 39.0 - 52.0 %   MCV 70.2 (L) 80.0 - 100.0 fL   MCH 20.5 (L) 26.0 - 34.0 pg   MCHC 29.2 (L) 30.0 - 36.0 g/dL   RDW 80.8 (H) 88.4 - 84.4 %   Platelets 233 150 - 400 K/uL   nRBC 0.0 0.0 - 0.2 %   Neutrophils Relative % 60 %   Neutro Abs 4.4 1.7 - 7.7 K/uL   Lymphocytes Relative 28 %   Lymphs Abs 2.0 0.7 - 4.0 K/uL   Monocytes Relative 10 %   Monocytes Absolute 0.7 0.1 - 1.0 K/uL   Eosinophils Relative 1 %   Eosinophils Absolute 0.0 0.0 - 0.5 K/uL   Basophils Relative 1 %   Basophils Absolute 0.0 0.0 - 0.1 K/uL   Immature Granulocytes 0 %   Abs Immature Granulocytes 0.02 0.00 - 0.07 K/uL    Comment: Performed at [facility], [address] [place], [place] [address]  Comprehensive metabolic panel with GFR     Status: Abnormal   Collection Time: 03/01/24  5:50 AM  Result Value Ref Range   Sodium 135 135 - 145 mmol/L   Potassium 3.5 3.5 - 5.1 mmol/L   Chloride 103 98 - 111 mmol/L   CO2 22 22 - 32 mmol/L   Glucose, Bld 73 70 - 99 mg/dL    Comment: Glucose reference range applies only to samples taken after fasting for at least 8 hours.   BUN 22 8 - 23 mg/dL   Creatinine, Ser 9.33 0.61 - 1.24 mg/dL   Calcium 8.6 (L) 8.9 - 10.3 mg/dL   Total Protein 5.6 (L) 6.5 - 8.1 g/dL   Albumin 3.4 (L) 3.5 - 5.0 g/dL   AST 16 15 - 41 U/L   ALT 12 0 - 44 U/L   Alkaline Phosphatase 29 (L) 38 - 126 U/L   Total Bilirubin 0.5 0.0 - 1.2 mg/dL   GFR, Estimated >39 >39 mL/min    Comment: (NOTE) Calculated using the CKD-EPI Creatinine Equation (2021)    Anion gap 10 5 - 15    Comment: Performed at [facility], [address], [place], [place] [address]  Magnesium     Status: None   Collection Time: 03/01/24  5:50 AM  Result Value Ref Range   Magnesium 2.1 1.7 - 2.4 mg/dL    Comment: Performed at [facility], [address], [place], [place] [address]  Prepare RBC (crossmatch)     Status: None   Collection Time: 03/01/24  8:13 AM  Result Value Ref Range   Order Confirmation      ORDER PROCESSED BY BLOOD BANK Performed at [facility], [address] [place], [place] [address]    CT ANGIO GI BLEED Result Date: 03/01/2024 CLINICAL DATA:  Dizziness and diaphoresis, recent history of dark stools EXAM: CTA ABDOMEN AND PELVIS WITHOUT AND WITH CONTRAST TECHNIQUE: Multidetector CT imaging of the abdomen and pelvis was performed using the standard protocol during bolus administration of intravenous contrast. Multiplanar reconstructed images and MIPs were obtained and reviewed to evaluate the vascular anatomy. RADIATION DOSE REDUCTION: This exam was performed  according to the departmental dose-optimization program which includes automated exposure  control, adjustment of the mA and/or kV according to patient size and/or use of iterative reconstruction technique. CONTRAST:  OMNIPAQUE  IOHEXOL  350 MG/ML SOLN COMPARISON:  None Available. FINDINGS: VASCULAR Aorta: Abdominal aorta demonstrates atherosclerotic calcifications and mild tortuosity. No aneurysmal dilatation is seen. Celiac: Patent without evidence of aneurysm, dissection, vasculitis or significant stenosis. SMA: Patent without evidence of aneurysm, dissection, vasculitis or significant stenosis. Renals: Dual renal arteries are identified bilaterally. No focal stenosis is seen. IMA: Patent without evidence of aneurysm, dissection, vasculitis or significant stenosis. Inflow: Iliacs demonstrate mild calcification without acute abnormality. Veins: No specific venous abnormality is seen. Review of the MIP images confirms the above findings. NON-VASCULAR Lower chest: No acute abnormality. Hepatobiliary: Gallstone is noted in the dependent portion of the gallbladder. No complicating factors are seen. The liver is within normal limits. Pancreas: Unremarkable. No pancreatic ductal dilatation or surrounding inflammatory changes. Spleen: Normal in size without focal abnormality. Adrenals/Urinary Tract: Adrenal glands are within normal limits. Kidneys demonstrate a normal enhancement pattern bilaterally. No renal calculi or obstructive changes are seen. The bladder is well distended. The prostate indents upon the inferior aspect of the bladder. Stomach/Bowel: Obstructive or inflammatory changes of the colon are seen. The appendix is within normal limits. The stomach and small bowel show no findings to suggest acute GI hemorrhage. No delayed pooling of contrast is seen. Postsurgical changes in the small bowel noted. Lymphatic: No significant lymphadenopathy is noted. Reproductive: Prostate is enlarged indenting upon the  inferior aspect of the bladder. Other: No abdominal wall hernia or abnormality. No abdominopelvic ascites. Musculoskeletal: Degenerative changes of lumbar spine are noted. No acute abnormality is seen. IMPRESSION: VASCULAR Scattered atherosclerotic calcifications without acute abnormality. No findings to suggest acute GI hemorrhage are noted. NON-VASCULAR Cholelithiasis without complicating factors. Prominent prostate indenting upon the inferior aspect of the bladder. Electronically Signed   By: Oneil Devonshire M.D.   On: 03/01/2024 00:32    Pending Labs Unresulted Labs (From admission, onward)     Start     Ordered   03/02/24 0500  Hemoglobin A1c  Tomorrow morning,   R       Comments: To assess prior glycemic control    03/01/24 0944   03/01/24 1300  Hemoglobin and hematocrit, blood  Once-Timed,   TIMED        03/01/24 0204   03/01/24 0900  Hemoglobin and hematocrit, blood  Once-Timed,   TIMED        03/01/24 0204   03/01/24 0800  Protime-INR  Once-Timed,   TIMED        03/01/24 0226   03/01/24 0205  Ferritin  Add-on,   AD        03/01/24 0204   03/01/24 0205  Iron and TIBC  Add-on,   AD        03/01/24 0204            Vitals/Pain Today's Vitals   03/01/24 0221 03/01/24 0600 03/01/24 0601 03/01/24 0649  BP: 116/73 119/76    Pulse: 69 (!) 59 64   Resp: 16 17    Temp: 97.9 F (36.6 C)   (!) 97.3 F (36.3 C)  TempSrc: Oral   Oral  SpO2:  98%    PainSc:        Isolation Precautions No active isolations  Medications Medications  pantoprazole  (PROTONIX ) injection 40 mg (40 mg Intravenous Given 02/29/24 2256)    Followed by  pantoprazole  (PROTONIX ) injection 40 mg (has no administration in time range)  acetaminophen  (TYLENOL ) tablet 650 mg (has no administration in time range)    Or  acetaminophen  (TYLENOL ) suppository 650 mg (has no administration in time range)  ondansetron  (ZOFRAN ) injection 4 mg (has no administration in time range)  0.9 %  sodium chloride  infusion  (Manually program via Guardrails IV Fluids) (has no administration in time range)  insulin  aspart (novoLOG ) injection 0-9 Units (has no administration in time range)  0.9 %  sodium chloride  infusion (Manually program via Guardrails IV Fluids) (0 mLs Intravenous Stopped 03/01/24 0410)  phytonadione  (VITAMIN K) 5 mg in dextrose  5 % 50 mL IVPB (0 mg Intravenous Stopped 03/01/24 0136)  iohexol  (OMNIPAQUE ) 350 MG/ML injection 100 mL (100 mLs Intravenous Contrast Given 03/01/24 0002)    Mobility non-ambulatory

## 2024-03-01 NOTE — ED Notes (Addendum)
 Patient transported to CT w/ paramedic

## 2024-03-01 NOTE — H&P (View-Only) (Signed)
 Referring Provider: Dr. Celinda Primary Care Physician:  Radiontchenko, Alexei, MD Primary Gastroenterologist:  Sampson  Reason for Consultation:  Melena  HPI: Mitchell Bright is a 73 y.o. male with 1 week history of black tarry stools in the setting of Coumadin .  Denies abdominal pain nausea vomiting or hematochezia.  Got lightheaded and had a near syncopal episode prior to coming to the hospital.  INR > 10. Hgb 6.7. Reports having a diverticular bleed years ago and had a subsequent sigmoid colectomy due to that in 2005.  Last colonoscopy in September 2024 that showed a normal-appearing colocolonic anastomosis in the sigmoid colon ([facility]).  EGD in the near future was planned to evaluate his iron deficiency anemia and history of NSAID use per outpatient GI note reviewed from May.  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  He takes Excedrin 3 times per week for headaches.  Denies alcohol use.  Past Medical History:  Diagnosis Date   Atrial fibrillation [facility])    chronic   Carcinoid tumor    Carotid artery dissection (HCC)    History of previous spontaneous carotid artery dissection   Diabetes mellitus    Hyperlipidemia    S/P small bowel resection    History of a partial bowel resection for a carcinoid   Seizure disorder [facility])     Past Surgical History:  Procedure Laterality Date   PARTIAL COLECTOMY     left, sigmoid colectomy by Dr. Curvin in 2005   SMALL INTESTINE SURGERY      Prior to Admission medications   Medication Sig Start Date End Date Taking? Authorizing Provider  digoxin  (LANOXIN ) 0.125 MG tablet Take 0.125 mg by mouth daily.   Yes [provider]  diltiazem  (TIAZAC ) 180 MG 24 hr capsule Take 180 mg by mouth daily.   Yes [provider]  DULoxetine  (CYMBALTA ) 60 MG capsule Take 60 mg by mouth 2 (two) times daily.   Yes [provider]  EXCEDRIN MIGRAINE 250-250-65 MG tablet Take 1 tablet by mouth every 6  (six) hours as needed (for headaches).   Yes [provider]  fenofibrate  160 MG tablet Take 160 mg by mouth daily.   Yes [provider]  finasteride (PROSCAR) 5 MG tablet Take 5 mg by mouth daily.   Yes [provider]  fluticasone (FLONASE) 50 MCG/ACT nasal spray Place 1 spray into both nostrils 2 (two) times daily as needed for allergies or rhinitis.   Yes [provider]  icosapent Ethyl (VASCEPA) 1 g capsule Take 1 g by mouth 2 (two) times daily.   Yes [provider]  lamoTRIgine  (LAMICTAL  XR) 100 MG 24 hour tablet Take 100 mg by mouth daily.   Yes [provider]  LANTUS  SOLOSTAR 100 UNIT/ML Solostar Pen Inject 25 Units into the skin in the morning.   Yes [provider]  Melatonin 1 MG SUBL Place 1.5 mg under the tongue at bedtime as needed (for sleep).   Yes [provider]  polyethylene glycol (MIRALAX / GLYCOLAX) 17 g packet Take 17 g by mouth daily as needed for mild constipation.   Yes [provider]  pregabalin  (LYRICA ) 100 MG capsule Take 100 mg by mouth at bedtime.   Yes [provider]  ramipril (ALTACE) 5 MG capsule Take 5 mg by mouth daily.   Yes [provider]  SENNA S 8.6-50 MG tablet Take 2 tablets by mouth in the morning.   Yes [provider]  SYNJARDY 01-999 MG TABS Take 1 tablet by mouth in the morning and at bedtime.   Yes [provider]  tamsulosin  (FLOMAX ) 0.4 MG CAPS capsule Take 1 capsule (0.4 mg total) by mouth daily. 07/25/22  Yes Harris, Abigail, PA-C  TYLENOL  500 MG tablet Take 500 mg by mouth every 6 (six) hours as needed (for headaches).   Yes [provider]  warfarin (COUMADIN ) 5 MG tablet Adjustment of Coumadin  dose to maintain INR 2-3 Patient taking differently: Take 5 mg by mouth See admin instructions. Take 5 mg by mouth in the morning on only Tuesdays and Thursdays 07/13/22  Yes Kc, Mennie, MD  warfarin (COUMADIN ) 7.5 MG tablet  Take 7.5 mg by mouth See admin instructions. Take 7.5 mg by mouth in the morning only on Sun/Mon/Wed/Fri/Sat   Yes [provider]  Glucose (TRUEPLUS GLUCOSE) 15 GM/32ML GEL Take 32 mLs by mouth as needed (for hypoglycemia).    [provider]  insulin  glargine (LANTUS ) 100 UNIT/ML injection Inject 0.1 mLs (10 Units total) into the skin at bedtime. Patient not taking: Reported on 02/29/2024 07/13/22   Christobal Mennie, MD  ondansetron  (ZOFRAN -ODT) 4 MG disintegrating tablet Take 1 tablet (4 mg total) by mouth every 8 (eight) hours as needed for nausea or vomiting. 4mg  ODT q4 hours prn nausea/vomit Patient not taking: Reported on 02/29/2024 07/25/22   Harris, Abigail, PA-C  pantoprazole  (PROTONIX ) 40 MG tablet Take 1 tablet (40 mg total) by mouth daily. Patient not taking: Reported on 02/29/2024 07/13/22 02/29/24  Christobal Mennie, MD    Scheduled Meds:  sodium chloride    Intravenous Once   pantoprazole  (PROTONIX ) IV  40 mg Intravenous Q12H   Continuous Infusions: PRN Meds:.acetaminophen  **OR** acetaminophen , ondansetron  (ZOFRAN ) IV  Allergies as of 02/29/2024 - Review Complete 02/29/2024  Allergen Reaction Noted   Apixaban Nausea And Vomiting and Other (See Comments) 04/21/2016   Oseltamivir phosphate Palpitations and Other (See Comments) 08/19/2009    History reviewed. No pertinent family history.  Social History   Socioeconomic History   Marital status: Married    Spouse name: Not on file   Number of children: Not on file   Years of education: Not on file   Highest education level: Not on file  Occupational History   Not on file  Tobacco Use   Smoking status: Never   Smokeless tobacco: Never  Vaping Use   Vaping status: Never Used  Substance and Sexual Activity   Alcohol use: Yes    Comment: rarely   Drug use: Not on file   Sexual activity: Not on file  Other Topics Concern   Not on file  Social History Narrative   Not on file   Social Drivers of Health    Financial Resource Strain: Low Risk  (10/23/2023)   Received from [facility]   Overall Financial Resource Strain (CARDIA)    Difficulty of Paying Living Expenses: Not hard at all  Food Insecurity: No Food Insecurity (10/23/2023)   Received from [facility]   Hunger Vital Sign    Within the past 12 months, you worried that your food would run out before you got the money to buy more.: Never true    Within the past 12 months, the food you bought just didn't last and you didn't have money to get more.: Never true  Transportation Needs: No Transportation Needs (10/23/2023)   Received from [facility] - Transportation    Lack of Transportation (Medical):  No    Lack of Transportation (Non-Medical): No  Physical Activity: Sufficiently Active (10/23/2023)   Received from [facility]   Exercise Vital Sign    On average, how many days per week do you engage in moderate to strenuous exercise (like a brisk walk)?: 4 days    On average, how many minutes do you engage in exercise at this level?: 40 min  Stress: No Stress Concern Present (10/23/2023)   Received from [facility] - Occupational Stress Questionnaire    Feeling of Stress : Only a little  Social Connections: Socially Integrated (10/23/2023)   Received from [facility]   Social Network    How would you rate your social network (family, work, friends)?: Good participation with social networks  Intimate Partner Violence: Not At Risk (10/23/2023)   Received from [facility]   HITS    Over the last 12 months how often did your partner physically hurt you?: Never    Over the last 12 months how often did your partner insult you or talk down to you?: Rarely    Over the last 12 months how often did your partner threaten you with physical harm?: Never    Over the last 12 months how often did your partner scream or curse at you?: Never    Review of Systems: All negative except as  stated above in HPI.  Physical Exam: Vital signs: Vitals:   03/01/24 0601 03/01/24 0649  BP:    Pulse: 64   Resp:    Temp:  (!) 97.3 F (36.3 C)  SpO2:    BP 121/74   General:   Alert,  Well-developed, well-nourished, pleasant and cooperative in NAD Head: normocephalic, atraumatic Eyes: anicteric sclera ENT: oropharynx clear Neck: supple, nontender Lungs:  Clear throughout to auscultation.   No wheezes, crackles, or rhonchi. No acute distress. Heart:  Regular rate and rhythm; no murmurs, clicks, rubs,  or gallops. Abdomen: soft, nontender, nondistended, +BS  Rectal:  Deferred Ext: no edema  GI:  Lab Results: Recent Labs    02/29/24 1918 03/01/24 0142 03/01/24 0550  WBC 11.0*  --  7.3  HGB 6.7* 6.7* 7.1*  HCT 23.7* 23.3* 24.3*  PLT 274  --  233   BMET Recent Labs    02/29/24 1918 03/01/24 0550  NA 135 135  K 4.4 3.5  CL 105 103  CO2 20* 22  GLUCOSE 193* 73  BUN 24* 22  CREATININE 0.78 0.66  CALCIUM 8.2* 8.6*   LFT Recent Labs    03/01/24 0550  PROT 5.6*  ALBUMIN 3.4*  AST 16  ALT 12  ALKPHOS 29*  BILITOT 0.5   PT/INR Recent Labs    02/29/24 2052 03/01/24 0142  LABPROT >90.0* 79.1*  INR >10.0* 9.4*     Studies/Results:   Impression/Plan: Melena in the setting of a supratherapeutic INR > 10 concerning for mucosal bleeding versus peptic ulcer disease.  Coumadin  on hold and after correction of INR will need an upper endoscopy this admission to further evaluate for peptic ulcer disease.  Continue IV PPI Q 12 hours. Will tentatively plan for EGD to be done tomorrow if INR is corrected.  Clear liquid diet.  N.p.o. after midnight.    LOS: 0 days   Mitchell Bright  03/01/2024, 9:16 AM  Questions please call [phone]

## 2024-03-01 NOTE — Plan of Care (Signed)
  Problem: Education: Goal: Ability to describe self-care measures that may prevent or decrease complications (Diabetes Survival Skills Education) will improve Outcome: Progressing   Problem: Coping: Goal: Ability to adjust to condition or change in health will improve Outcome: Progressing   Problem: Nutritional: Goal: Progress toward achieving an optimal weight will improve Outcome: Progressing   Problem: Skin Integrity: Goal: Risk for impaired skin integrity will decrease Outcome: Progressing

## 2024-03-01 NOTE — Progress Notes (Signed)
  Carryover admission to the Day Admitter.  I discussed this case with the EDP, Dr. Griselda.   Per these discussions:   This is a 73 y.o male with history of paroxysmal atrial fibrillation chronically anticoagulated on warfarin, who is being admitted with suspected acute lower gastrointestinal bleed complicated by acute on chronic anemia, as well as presenting supratherapeutic INR after presenting with a single episode of syncope as well as 1 day of intermittent dark red stool.  Of note, patient has had no additional episodes of bloody bowel movements throughout his ED course this evening.  Vital signs in the emergency department notable for the following: Afebrile; heart rates in the 60s to 70s; systolic blood pressures in the 1 teens to 120s mmHg.   Presenting hemoglobin found to be 6.7 compared to his baseline of approximately 9.5.  A repeat hemoglobin was ordered and found to be 6.7 (prior to transfusion).  He is currently receiving transfusion of 1 unit PRBC.   Additional presenting labs are notable for INR greater than 10 for which she received 5 mg of IV vitamin K, with repeat INR currently pending.  His BUN is noted to be slightly elevated at 24, potentially suggestive of an upper GI source.  CTA GI bleed study was performed and showed no evidence of active contrast extravasation.  He has also received IV Protonix  in the ED this evening.   EDP has contacted on-call [facility], Dr. Elicia, to request consult.   I have placed an order for observation to the stepdown unit for further evaluation management of the above.   I have placed some additional preliminary admit orders via the adult multi-morbid admission order set. I have also ordered n.p.o., continued existing Protonix  40 mg IV twice daily, and added on iron studies to pretransfusion specimen.  He is continuing to receive transfusion 1 unit PRBC at this time.  I have ordered morning labs in the form of CMP, CBC, magnesium level.   Additionally, have ordered every 4 hour H&H checks through 1300 today. Holding home warfarin.     Eva Pore, DO Hospitalist

## 2024-03-01 NOTE — H&P (Signed)
 History and Physical    Patient: Mitchell Bright DOB: 10-01-1950 DOA: 02/29/2024 DOS: the patient was seen and examined on 03/01/2024 PCP: Nile Harvard, MD  Patient coming from: Home  Chief Complaint:  Chief Complaint  Patient presents with   Loss of Consciousness   HPI: Mitchell Bright is a 73 y.o. 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 The patient stated that he was walking into a restaurant when he started feeling lightheaded and diaphoretic. He lost consciousness and does not remember falling. He has been using a tablet of Excedrin for headaches on some days recently. No abdominal pain, nausea, emesis, diarrhea but occasionally gets constipated. No flank pain, dysuria, frequency or hematuria. He denied fever, chills, rhinorrhea, sore throat, wheezing or hemoptysis. No chest pain, palpitations, diaphoresis, PND, orthopnea or pitting edema of the lower extremities. No polyuria, polydipsia, polyphagia or blurred vision.   Lab work: Urinalysis showed greater than 500 glucose and ketones of 5 mg/dL.  Fecal occult blood was positive.  CBC showed white count 11.0, hemoglobin 6.7 g/dL and platelets 725.  PT was greater than 90 seconds and INR greater than 10.0.  CMP showed a CO2 of 20 mmol/L with a normal anion gap.  Glucose 193, BUN 24, creatinine 0.78 and corrected calcium 8.8 mg/dL.  Total protein 5.6 and albumin 3.3 g/dL.  Normal transaminases and bilirubin.  Alkaline phosphatase was low at 26 units/L.  Imaging: CTA GI bleed with no findings suggesting of acute GI hemorrhage.  There were scattered calcifications without acute abnormality.  Cholelithiasis without complicating factors.   Prostatomegaly.   ED course: Initial vital signs were temperature 98.4 F, pulse 120 constipation 16, BP 114/63 mmHg O2 sat 90% on room air.  Patient received pantoprazole  40 mg IVP vitamin K 5 mg IVP and 2 units of PRBC were ordered while in the ED, first unit by the ED PA and I ordered a second unit.  Review of Systems: As mentioned in the history of present illness. All other systems reviewed and are negative. Past Medical History:  Diagnosis Date   Atrial fibrillation [facility])    chronic   Carcinoid tumor    Carotid artery dissection (HCC)    History of previous spontaneous carotid artery dissection   Diabetes mellitus    Hyperlipidemia    S/P small bowel resection    History of a partial bowel resection for a carcinoid   Seizure disorder [facility])    Past Surgical History:  Procedure Laterality Date   PARTIAL COLECTOMY     left, sigmoid colectomy by Dr. Curvin in 2005   SMALL INTESTINE SURGERY     Social History:  reports that he has never smoked. He has never used smokeless tobacco. He reports current alcohol use. No history on file for drug use.  Allergies  Allergen Reactions   Apixaban Nausea And Vomiting and Other (See Comments)    GI Bleeding also   Oseltamivir Phosphate Palpitations and Other (See Comments)    Drowsiness, also     History reviewed. No pertinent family history.  Prior to Admission medications   Medication Sig Start Date End Date Taking? Authorizing Provider  digoxin  (LANOXIN ) 0.125 MG tablet Take 0.125 mg by mouth daily.   Yes [provider]  diltiazem  (TIAZAC ) 180 MG 24 hr capsule Take 180 mg by mouth daily.   Yes [provider]  DULoxetine  (CYMBALTA ) 60 MG capsule Take 60 mg by mouth 2 (two) times daily.   Yes [provider]  EXCEDRIN MIGRAINE 250-250-65 MG tablet Take 1 tablet by mouth every 6 (six) hours as needed (for headaches).   Yes [provider]  fenofibrate  160 MG tablet Take 160 mg by mouth daily.   Yes  [provider]  finasteride (PROSCAR) 5 MG tablet Take 5 mg by mouth daily.   Yes [provider]  fluticasone (FLONASE) 50 MCG/ACT nasal spray Place 1 spray into both nostrils 2 (two) times daily as needed for allergies or rhinitis.   Yes [provider]  icosapent Ethyl (VASCEPA) 1 g capsule Take 1 g by mouth 2 (two) times daily.   Yes [provider]  lamoTRIgine  (LAMICTAL  XR) 100 MG 24 hour tablet Take 100 mg by mouth daily.   Yes [provider]  LANTUS  SOLOSTAR 100 UNIT/ML Solostar Pen Inject 25 Units into the skin in the morning.   Yes [provider]  Melatonin 1 MG SUBL Place 1.5 mg under the tongue at bedtime as needed (for sleep).   Yes [provider]  polyethylene glycol (MIRALAX / GLYCOLAX) 17 g packet Take 17 g by mouth daily as needed for mild constipation.   Yes [provider]  pregabalin  (LYRICA ) 100 MG capsule Take 100 mg by mouth at bedtime.   Yes [provider]  ramipril (ALTACE) 5 MG capsule Take 5 mg by mouth daily.   Yes [provider]  SENNA S 8.6-50 MG tablet Take 2 tablets by mouth in the morning.   Yes [provider]  SYNJARDY 01-999 MG TABS Take 1 tablet by mouth in the morning and at bedtime.   Yes [provider]  tamsulosin  (FLOMAX ) 0.4 MG CAPS capsule Take 1 capsule (0.4 mg total) by mouth daily. 07/25/22  Yes Harris, Abigail, PA-C  TYLENOL  500 MG tablet Take 500 mg by mouth every 6 (six) hours as needed (for headaches).   Yes [provider]  warfarin (COUMADIN ) 5 MG tablet Adjustment of Coumadin  dose to maintain INR 2-3 Patient taking differently: Take 5 mg by mouth See admin instructions. Take 5 mg by mouth in the morning on only Tuesdays and Thursdays 07/13/22  Yes Kc, Mennie, MD  warfarin (COUMADIN ) 7.5 MG tablet Take 7.5 mg by mouth See admin instructions. Take 7.5 mg by mouth in the morning only on Sun/Mon/Wed/Fri/Sat   Yes [provider]  Glucose (TRUEPLUS GLUCOSE) 15 GM/32ML GEL Take 32 mLs by mouth as needed (for hypoglycemia).    [provider]  insulin  glargine (LANTUS ) 100 UNIT/ML injection Inject 0.1 mLs (10 Units total) into the skin at bedtime. Patient not taking: Reported on 02/29/2024 07/13/22   Christobal Mennie, MD  ondansetron  (ZOFRAN -ODT) 4 MG disintegrating tablet Take 1 tablet (4 mg total) by mouth every 8 (eight) hours as needed for nausea or vomiting. 4mg  ODT q4 hours prn nausea/vomit Patient not taking: Reported on 02/29/2024 07/25/22   Harris, Abigail, PA-C  pantoprazole  (PROTONIX ) 40 MG tablet Take 1 tablet (40 mg total) by mouth daily. Patient not taking: Reported on 02/29/2024 07/13/22 02/29/24  Christobal Mennie, MD    Physical Exam: Vitals:   03/01/24 0221 03/01/24 0600 03/01/24 0601 03/01/24 0649  BP: 116/73 119/76    Pulse: 69 (!) 59 64   Resp: 16 17  Temp: 97.9 F (36.6 C)   (!) 97.3 F (36.3 C)  TempSrc: Oral   Oral  SpO2:  98%     Physical Exam Vitals and nursing note reviewed.  Constitutional:      General: He is awake. He is not in acute distress.    Appearance: He is ill-appearing.  HENT:     Head: Normocephalic.     Nose: No rhinorrhea.     Mouth/Throat:     Mouth: Mucous membranes are moist.   Eyes:     General: No scleral icterus.    Pupils: Pupils are equal, round, and reactive to light.   Neck:     Vascular: No JVD.   Cardiovascular:     Rate and Rhythm: Normal rate and regular rhythm.     Heart sounds: S1 normal and S2 normal.  Pulmonary:     Effort: Pulmonary effort is normal.     Breath sounds: No wheezing, rhonchi or rales.  Abdominal:     General: There is no distension.     Palpations: Abdomen is soft.     Tenderness: There is no abdominal tenderness. There is no right CVA tenderness or left CVA tenderness.   Musculoskeletal:     Cervical back: Neck supple.     Right lower leg: No edema.     Left lower leg: No edema.   Skin:    General: Skin  is warm and dry.     Coloration: Skin is pale.   Neurological:     General: No focal deficit present.     Mental Status: He is alert and oriented to person, place, and time.   Psychiatric:        Mood and Affect: Mood normal.        Behavior: Behavior normal. Behavior is cooperative.     Data Reviewed:  Results are pending, will review when available.  EKG: Vent. rate 73 BPM PR interval * ms QRS duration 144 ms QT/QTcB 394/435 ms P-R-T axes * 41 -3 Atrial fibrillation Right bundle branch block  Assessment and Plan: Principal Problem:   ABLA (acute blood loss anemia) Superimposed on:   Iron deficiency anemia due to chronic blood loss In the setting of:   Acute lower GI bleeding Due to:   Supratherapeutic INR Admit to stepdown/inpatient. Keep NPO after midnight. Clear liquid diet. Hold warfarin. Continue pantoprazole  IV twice daily. Monitor H&H. Transfuse further as needed. GI consult appreciated.  Active Problems:   HYPERCHOLESTEROLEMIA Continue fenofibrate  160 mg p.o. daily.    ATRIAL FIBRILLATION Hold warfarin. Continue diltiazem  180 mg p.o. daily. Continue digoxin  125 mcg p.o. daily.    Diabetic peripheral neuropathy (HCC) Continue duloxetine  60 mg p.o. twice daily. Continue pregabalin  100 mg p.o. bedtime.    Type 2 diabetes mellitus (HCC) Carbohydrate modified diet. Hold Synjardy for now. Decrease long-acting insulin  to 20 units. CBG monitoring 4 times daily. Check hemoglobin A1c.    Hypertension Hold ACE inhibitor. -Ramipril 5 mg daily. On diltiazem  as above.    Mild protein malnutrition (HCC) In the setting of acute anemia. Follow-up albumin level in a.m.     Advance Care Planning:   Code Status: Full Code   Consults: [facility] Mitchell Sol, MD).  Family Communication: His spouse was at bedside.  Severity of Illness: The appropriate patient status for this patient is OBSERVATION. Observation status is judged to be reasonable  and necessary in order to provide the required intensity of service to ensure the patient's safety.  The patient's presenting symptoms, physical exam findings, and initial radiographic and laboratory data in the context of their medical condition is felt to place them at decreased risk for further clinical deterioration. Furthermore, it is anticipated that the patient will be medically stable for discharge from the hospital within 2 midnights of admission.   Author: Alm Dorn Castor, MD 03/01/2024 8:13 AM  For on call review [email or web address].   This document was prepared using Dragon voice recognition software and may contain some unintended transcription errors.

## 2024-03-02 ENCOUNTER — Other Ambulatory Visit (HOSPITAL_COMMUNITY): Payer: Self-pay

## 2024-03-02 ENCOUNTER — Inpatient Hospital Stay (HOSPITAL_COMMUNITY): Payer: Self-pay | Admitting: Anesthesiology

## 2024-03-02 ENCOUNTER — Encounter (HOSPITAL_COMMUNITY)
Admission: EM | Disposition: A | Discharge status: Home / Self Care | Payer: Self-pay | Source: Home / Self Care | Attending: Internal Medicine

## 2024-03-02 ENCOUNTER — Telehealth (HOSPITAL_COMMUNITY): Payer: Self-pay | Admitting: Pharmacy Technician

## 2024-03-02 DIAGNOSIS — K922 Gastrointestinal hemorrhage, unspecified: Secondary | ICD-10-CM | POA: Diagnosis not present

## 2024-03-02 DIAGNOSIS — K2970 Gastritis, unspecified, without bleeding: Secondary | ICD-10-CM | POA: Diagnosis not present

## 2024-03-02 DIAGNOSIS — I4891 Unspecified atrial fibrillation: Secondary | ICD-10-CM

## 2024-03-02 DIAGNOSIS — E7800 Pure hypercholesterolemia, unspecified: Secondary | ICD-10-CM | POA: Diagnosis not present

## 2024-03-02 DIAGNOSIS — K921 Melena: Secondary | ICD-10-CM | POA: Diagnosis present

## 2024-03-02 DIAGNOSIS — I10 Essential (primary) hypertension: Secondary | ICD-10-CM

## 2024-03-02 HISTORY — PX: ESOPHAGOGASTRODUODENOSCOPY: SHX5428

## 2024-03-02 HISTORY — PX: GIVENS CAPSULE STUDY: SHX5432

## 2024-03-02 LAB — GLUCOSE, CAPILLARY
Glucose-Capillary: 112 mg/dL — ABNORMAL HIGH (ref 70–99)
Glucose-Capillary: 124 mg/dL — ABNORMAL HIGH (ref 70–99)
Glucose-Capillary: 129 mg/dL — ABNORMAL HIGH (ref 70–99)
Glucose-Capillary: 114 mg/dL — ABNORMAL HIGH (ref 70–99)
Glucose-Capillary: 108 mg/dL — ABNORMAL HIGH (ref 70–99)
Glucose-Capillary: 200 mg/dL — ABNORMAL HIGH (ref 70–99)

## 2024-03-02 LAB — HEMOGLOBIN AND HEMATOCRIT, BLOOD
HCT: 26.3 % — ABNORMAL LOW (ref 39.0–52.0)
Hemoglobin: 8 g/dL — ABNORMAL LOW (ref 13.0–17.0)

## 2024-03-02 LAB — HEMOGLOBIN A1C
Hgb A1c MFr Bld: 7 % — ABNORMAL HIGH (ref 4.8–5.6)
Mean Plasma Glucose: 154.2 mg/dL

## 2024-03-02 LAB — PROTIME-INR
INR: 1.8 — ABNORMAL HIGH (ref 0.8–1.2)
Prothrombin Time: 21.8 s — ABNORMAL HIGH (ref 11.4–15.2)

## 2024-03-02 SURGERY — EGD (ESOPHAGOGASTRODUODENOSCOPY)
Anesthesia: Monitor Anesthesia Care

## 2024-03-02 MED ORDER — PROPOFOL 500 MG/50ML IV EMUL
INTRAVENOUS | Status: DC | PRN
  Administered 2024-03-02: 150 ug/kg/min via INTRAVENOUS

## 2024-03-02 MED ORDER — PROPOFOL 10 MG/ML IV BOLUS
INTRAVENOUS | Status: DC | PRN
Start: 2024-03-02 — End: 2024-03-02
  Administered 2024-03-02: 40 mg via INTRAVENOUS
  Administered 2024-03-02: 50 mg via INTRAVENOUS

## 2024-03-02 MED ORDER — MIDAZOLAM HCL 2 MG/2ML IJ SOLN
INTRAMUSCULAR | Status: AC
  Filled 2024-03-02: qty 2

## 2024-03-02 MED ORDER — SODIUM CHLORIDE 0.9 % IV SOLN: INTRAVENOUS | Status: AC

## 2024-03-02 MED ORDER — SIMETHICONE 40 MG/0.6ML PO SUSP
ORAL | Status: AC
  Filled 2024-03-02: qty 0.6

## 2024-03-02 MED ORDER — SODIUM CHLORIDE 0.9 % IV SOLN: INTRAVENOUS | Status: DC | PRN

## 2024-03-02 MED ORDER — MIDAZOLAM HCL 5 MG/5ML IJ SOLN
INTRAMUSCULAR | Status: DC | PRN
  Administered 2024-03-02: 2 mg via INTRAVENOUS

## 2024-03-02 MED ORDER — LIDOCAINE HCL (CARDIAC) PF 100 MG/5ML IV SOSY
PREFILLED_SYRINGE | INTRAVENOUS | Status: DC | PRN
  Administered 2024-03-02: 100 mg via INTRAVENOUS

## 2024-03-02 NOTE — Telephone Encounter (Signed)
 Patient Product/process development scientist completed.    The patient is insured through [employer].     Ran test claim for Xarelto 20 mg and the current 30 day co-pay is $0.00.   This test claim was processed through [facility]- copay amounts may vary at other pharmacies due to [employer], or as the patient moves through the different stages of their insurance plan.     Reyes Sharps, CPHT Pharmacy Technician III Certified Patient Advocate [facility] Patient Advocate Team Direct Number: [phone]  Fax: [phone]

## 2024-03-02 NOTE — Transfer of Care (Signed)
 Immediate Anesthesia Transfer of Care Note  Patient: Mitchell Bright  Procedure(s) Performed: EGD (ESOPHAGOGASTRODUODENOSCOPY)  Patient Location: PACU  Anesthesia Type:MAC  Level of Consciousness: awake, alert , and oriented  Airway & Oxygen Therapy: Patient Spontanous Breathing and Patient connected to nasal cannula oxygen  Post-op Assessment: Report given to RN and Post -op Vital signs reviewed and stable  Post vital signs: Reviewed and stable  Last Vitals:  Vitals Value Taken Time  BP 93/51 03/02/24 13:27  Temp 36 1327  Pulse 76 03/02/24 13:28  Resp 16 1328  SpO2 97 % 03/02/24 13:28  Vitals shown include unfiled device data.  Last Pain:  Vitals:   03/02/24 1202  TempSrc: Temporal  PainSc: 0-No pain         Complications: No notable events documented.

## 2024-03-02 NOTE — Progress Notes (Signed)
 [facility] video capsule endoscopy ordered by MD Dianna.  Patient ingested capsule at 1412.  Per Given's capsule instructions, patient to remain NPO until 1612 at which time they may progress to clear liquid diet. At 1812 patient may have a small snack such as a half a sandwich or a bowl of soup. At 2212 patient may progress to previously ordered diet.  The capsule endoscopy study will conclude at 0212 at which time the recorder and leads or belt can be removed and placed in a patient belongings bag. Endoscopy staff will pick up the equipment in the AM.  Instructions provided to patient and inpatient RN. Patient and RN demonstrated understanding.  Ozell VEAR Pouch, RN 03/02/24 2:15 PM

## 2024-03-02 NOTE — Plan of Care (Signed)

## 2024-03-02 NOTE — Anesthesia Preprocedure Evaluation (Addendum)
 Anesthesia Evaluation  Patient identified by MRN, date of birth, ID band Patient awake    Reviewed: Allergy & Precautions, H&P , NPO status , Patient's Chart, lab work & pertinent test results  Airway Mallampati: II  TM Distance: >3 FB Neck ROM: Full    Dental no notable dental hx. (+) Teeth Intact, Dental Advisory Given   Pulmonary neg pulmonary ROS   Pulmonary exam normal breath sounds clear to auscultation       Cardiovascular Exercise Tolerance: Good hypertension, Pt. on medications + Peripheral Vascular Disease  negative cardio ROS Normal cardiovascular exam+ dysrhythmias  Rhythm:Regular Rate:Normal     Neuro/Psych negative neurological ROS  negative psych ROS   GI/Hepatic negative GI ROS, Neg liver ROS,,,  Endo/Other  negative endocrine ROSdiabetes    Renal/GU negative Renal ROS  negative genitourinary   Musculoskeletal negative musculoskeletal ROS (+)    Abdominal   Peds negative pediatric ROS (+)  Hematology negative hematology ROS (+)   Anesthesia Other Findings   Reproductive/Obstetrics negative OB ROS                             Anesthesia Physical Anesthesia Plan  ASA: 3  Anesthesia Plan: MAC   Post-op Pain Management: Minimal or no pain anticipated   Induction: Intravenous  PONV Risk Score and Plan: 1 and Propofol infusion and Midazolam  Airway Management Planned: Natural Airway  Additional Equipment: None  Intra-op Plan:   Post-operative Plan:   Informed Consent: I have reviewed the patients History and Physical, chart, labs and discussed the procedure including the risks, benefits and alternatives for the proposed anesthesia with the patient or authorized representative who has indicated his/her understanding and acceptance.       Plan Discussed with: Anesthesiologist  Anesthesia Plan Comments: (BIS MONITOR)        Anesthesia Quick  Evaluation

## 2024-03-02 NOTE — Progress Notes (Signed)
 PROGRESS NOTE    NYAIR DEPAULO  FMW:[id number] DOB: 1951/06/30 DOA: 02/29/2024 PCP: Radiontchenko, Alexei, MD   Brief Narrative:  Mitchell Bright is a 73 y.o. 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, carotid artery dissection, type 2 diabetes, hyperlipidemia, seizure disorder who presented to the emergency department due to loss of consciousness in the setting of recent multiple episodes of melena -hospice called in for admission, GI called in consult  Assessment & Plan:   Principal Problem:   Acute lower GI bleeding Active Problems:   HYPERCHOLESTEROLEMIA   ATRIAL FIBRILLATION   Diabetic peripheral neuropathy (HCC)   Type 2 diabetes mellitus (HCC)   Hypertension   Supratherapeutic INR   ABLA (acute blood loss anemia)   Iron deficiency anemia due to chronic blood loss   Mild protein malnutrition (HCC)   Acute syncopal event secondary to profound anemia  - Resolved with blood transfusion, no further episodes, orthostatics negative, otherwise feels back to baseline  Acute blood loss anemia secondary to acute GI bleed Concurrent chronic iron deficiency anemia due to chronic blood loss Secondary to supratherapeutic INR Warfarin on hold - IV vitamin K  given at intake given critical bleed/profoundly elevated INR (>10) - INR downtrending appropriately -no further signs or symptoms of bleeding - GI following upper endoscopy did not reveal any acute bleeding, capsule endoscopy pending - PPI ongoing in the setting of exedrine use - Iron panel with remarkably low iron elevated TIBC likely secondary to blood loss  Atrial fibrillation, rate controlled Hold warfarin. Will transition from warfarin to Xarelto given patient's labile INR as above (reports history of intractable nausea with Eliquis) Continue diltiazem  180 mg p.o. daily. Continue digoxin  125 mcg p.o. daily.   Diabetic peripheral  neuropathy (HCC) Continue duloxetine  60 mg p.o. twice daily. Continue pregabalin  100 mg p.o. bedtime.   Type 2 diabetes mellitus (HCC) Carbohydrate modified diet. Hold Synjardy for now. Decrease long-acting insulin  to 20 units. CBG monitoring 4 times daily. Check hemoglobin A1c.   Hypertension Hold ACE inhibitor. On diltiazem  as above.   Hyperlipidemia Continue fenofibrate  160 mg p.o. daily.  Mild protein malnutrition (HCC) Discussed improving diet  DVT prophylaxis: SCDs Start: 03/01/24 0203   Code Status:   Code Status: Full Code  Family Communication: None present  Status is: Inpatient  Dispo: The patient is from: Home              Anticipated d/c is to: Home              Anticipated d/c date is: 24 to 48 hours              Patient currently not medically stable for discharge  Consultants:  GI  Procedures:  Planned upper endoscopy  Antimicrobials:  None  Subjective: No acute issues or events overnight denies nausea vomiting diarrhea constipation effusions or chest pain  Objective: Vitals:   03/02/24 0309 03/02/24 0400 03/02/24 0500 03/02/24 0600  BP:  121/70 122/71 114/65  Pulse:  77 79 79  Resp:  14 19 16   Temp: 97.7 F (36.5 C)     TempSrc: Oral     SpO2:  95% 97% 96%  Weight:   72.1 kg   Height:        Intake/Output Summary (Last 24 hours) at 03/02/2024 0718 Last data filed at 03/02/2024 0030 Gross per 24 hour  Intake 1741 ml  Output 2400 ml  Net -659 ml  Filed Weights   03/01/24 1121 03/02/24 0500  Weight: 72.5 kg 72.1 kg    Examination:  General:  Pleasantly resting in bed, No acute distress. HEENT:  Normocephalic atraumatic.  Sclerae nonicteric, noninjected.  Extraocular movements intact bilaterally. Neck:  Without mass or deformity.  Trachea is midline. Lungs:  Clear to auscultate bilaterally without rhonchi, wheeze, or rales. Heart:  Regular rate and rhythm.  Without murmurs, rubs, or gallops. Abdomen:  Soft, nontender,  nondistended.  Without guarding or rebound. Extremities: Without cyanosis, clubbing, edema, or obvious deformity. Skin:  Warm and dry, no erythema.  Data Reviewed: I have personally reviewed following labs and imaging studies  CBC: Recent Labs  Lab 02/29/24 1918 03/01/24 0142 03/01/24 0550 03/01/24 1100 03/01/24 1454  WBC 11.0*  --  7.3  --   --   NEUTROABS  --   --  4.4  --   --   HGB 6.7* 6.7* 7.1* 7.4* 8.2*  HCT 23.7* 23.3* 24.3* 25.0* 27.7*  MCV 68.1*  --  70.2*  --   --   PLT 274  --  233  --   --    Basic Metabolic Panel: Recent Labs  Lab 02/29/24 1918 03/01/24 0550  NA 135 135  K 4.4 3.5  CL 105 103  CO2 20* 22  GLUCOSE 193* 73  BUN 24* 22  CREATININE 0.78 0.66  CALCIUM 8.2* 8.6*  MG  --  2.1   GFR: Estimated Creatinine Clearance: 83.9 mL/min (by C-G formula based on SCr of 0.66 mg/dL). Liver Function Tests: Recent Labs  Lab 02/29/24 1918 03/01/24 0550  AST 23 16  ALT 13 12  ALKPHOS 26* 29*  BILITOT 0.8 0.5  PROT 5.6* 5.6*  ALBUMIN 3.3* 3.4*   No results for input(s): LIPASE, AMYLASE in the last 168 hours. No results for input(s): AMMONIA in the last 168 hours. Coagulation Profile: Recent Labs  Lab 02/29/24 2052 03/01/24 0142 03/01/24 1100  INR >10.0* 9.4* 2.5*   Cardiac Enzymes: No results for input(s): CKTOTAL, CKMB, CKMBINDEX, TROPONINI in the last 168 hours. BNP (last 3 results) No results for input(s): PROBNP in the last 8760 hours. HbA1C: No results for input(s): HGBA1C in the last 72 hours. CBG: Recent Labs  Lab 02/29/24 2057 03/01/24 1219 03/01/24 1639 03/01/24 2204  GLUCAP 222* 214* 84 135*   Lipid Profile: No results for input(s): CHOL, HDL, LDLCALC, TRIG, CHOLHDL, LDLDIRECT in the last 72 hours. Thyroid Function Tests: No results for input(s): TSH, T4TOTAL, FREET4, T3FREE, THYROIDAB in the last 72 hours. Anemia Panel: Recent Labs    03/01/24 1100  FERRITIN 3*  TIBC 521*  IRON  23*   Sepsis Labs: No results for input(s): PROCALCITON, LATICACIDVEN in the last 168 hours.  Recent Results (from the past 240 hours)  MRSA Next Gen by PCR, Nasal     Status: None   Collection Time: 03/01/24 10:37 AM   Specimen: Nasal Mucosa; Nasal Swab  Result Value Ref Range Status   MRSA by PCR Next Gen NOT DETECTED NOT DETECTED Final    Comment: (NOTE) The GeneXpert MRSA Assay (FDA approved for NASAL specimens only), is one component of a comprehensive MRSA colonization surveillance program. It is not intended to diagnose MRSA infection nor to guide or monitor treatment for MRSA infections. Test performance is not FDA approved in patients less than 58 years old. Performed at [facility], [address] [place], [place] [address]  Radiology Studies: CT ANGIO GI BLEED Result Date: 03/01/2024 CLINICAL DATA:  Dizziness and diaphoresis, recent history of dark stools EXAM: CTA ABDOMEN AND PELVIS WITHOUT AND WITH CONTRAST TECHNIQUE: Multidetector CT imaging of the abdomen and pelvis was performed using the standard protocol during bolus administration of intravenous contrast. Multiplanar reconstructed images and MIPs were obtained and reviewed to evaluate the vascular anatomy. RADIATION DOSE REDUCTION: This exam was performed according to the departmental dose-optimization program which includes automated exposure control, adjustment of the mA and/or kV according to patient size and/or use of iterative reconstruction technique. CONTRAST:  OMNIPAQUE  IOHEXOL  350 MG/ML SOLN COMPARISON:  None Available. FINDINGS: VASCULAR Aorta: Abdominal aorta demonstrates atherosclerotic calcifications and mild tortuosity. No aneurysmal dilatation is seen. Celiac: Patent without evidence of aneurysm, dissection, vasculitis or significant stenosis. SMA: Patent without evidence of aneurysm, dissection, vasculitis or significant stenosis. Renals: Dual renal arteries are  identified bilaterally. No focal stenosis is seen. IMA: Patent without evidence of aneurysm, dissection, vasculitis or significant stenosis. Inflow: Iliacs demonstrate mild calcification without acute abnormality. Veins: No specific venous abnormality is seen. Review of the MIP images confirms the above findings. NON-VASCULAR Lower chest: No acute abnormality. Hepatobiliary: Gallstone is noted in the dependent portion of the gallbladder. No complicating factors are seen. The liver is within normal limits. Pancreas: Unremarkable. No pancreatic ductal dilatation or surrounding inflammatory changes. Spleen: Normal in size without focal abnormality. Adrenals/Urinary Tract: Adrenal glands are within normal limits. Kidneys demonstrate a normal enhancement pattern bilaterally. No renal calculi or obstructive changes are seen. The bladder is well distended. The prostate indents upon the inferior aspect of the bladder. Stomach/Bowel: Obstructive or inflammatory changes of the colon are seen. The appendix is within normal limits. The stomach and small bowel show no findings to suggest acute GI hemorrhage. No delayed pooling of contrast is seen. Postsurgical changes in the small bowel noted. Lymphatic: No significant lymphadenopathy is noted. Reproductive: Prostate is enlarged indenting upon the inferior aspect of the bladder. Other: No abdominal wall hernia or abnormality. No abdominopelvic ascites. Musculoskeletal: Degenerative changes of lumbar spine are noted. No acute abnormality is seen. IMPRESSION: VASCULAR Scattered atherosclerotic calcifications without acute abnormality. No findings to suggest acute GI hemorrhage are noted. NON-VASCULAR Cholelithiasis without complicating factors. Prominent prostate indenting upon the inferior aspect of the bladder. Electronically Signed   By: Oneil Devonshire M.D.   On: 03/01/2024 00:32        Scheduled Meds:  Chlorhexidine  Gluconate Cloth  6 each Topical Daily   digoxin   0.125  mg Oral Daily   diltiazem   180 mg Oral Daily   DULoxetine   60 mg Oral BID   fenofibrate   160 mg Oral Daily   finasteride   5 mg Oral Daily   insulin  aspart  0-9 Units Subcutaneous TID WC   insulin  glargine-yfgn  20 Units Subcutaneous Daily   lamoTRIgine   100 mg Oral Daily   pantoprazole  (PROTONIX ) IV  40 mg Intravenous Q12H   pregabalin   100 mg Oral QHS   tamsulosin   0.4 mg Oral Daily   Continuous Infusions:  sodium chloride  20 mL/hr at 03/02/24 0020     LOS: 1 day   Time spent:  Elsie JAYSON Montclair, DO [facility]  If 7PM-7AM, please contact night-coverage [email or web address]  03/02/2024, 7:18 AM

## 2024-03-02 NOTE — Interval H&P Note (Signed)
 History and Physical Interval Note:  03/02/2024 12:45 PM  [employer]  has presented today for surgery, with the diagnosis of Melena.  The various methods of treatment have been discussed with the patient and family. After consideration of risks, benefits and other options for treatment, the patient has consented to  Procedure(s): EGD (ESOPHAGOGASTRODUODENOSCOPY) (N/A) as a surgical intervention.  The patient's history has been reviewed, patient examined, no change in status, stable for surgery.  I have reviewed the patient's chart and labs.  Questions were answered to the patient's satisfaction.     Mitchell Bright

## 2024-03-02 NOTE — Op Note (Signed)
 [facility] Patient Name: Mitchell Bright Procedure Date: 03/02/2024 MRN: [id number] Attending MD: Jerrell JAYSON Sol , MD, [id number] Date of Birth: 03-Jan-1951 CSN: [id number] Age: 73 Admit Type: Inpatient Procedure:                Upper GI endoscopy Indications:              Melena Providers:                Jerrell KYM Sol, MD, Particia Fischer, RN, Fairy Marina, Technician Referring MD:             hospital team Medicines:                Propofol per Anesthesia, Monitored Anesthesia Care Complications:            No immediate complications. Estimated Blood Loss:     Estimated blood loss: none. Procedure:                Pre-Anesthesia Assessment:                           - Prior to the procedure, a History and Physical                            was performed, and patient medications and                            allergies were reviewed. The patient's tolerance of                            previous anesthesia was also reviewed. The risks                            and benefits of the procedure and the sedation                            options and risks were discussed with the patient.                            All questions were answered, and informed consent                            was obtained. Prior Anticoagulants: The patient has                            taken Coumadin  (warfarin), last dose was stopped at                            admission. ASA Grade Assessment: III - A patient                            with severe systemic disease. After reviewing the  risks and benefits, the patient was deemed in                            satisfactory condition to undergo the procedure.                           After obtaining informed consent, the endoscope was                            passed under direct vision. Throughout the                            procedure, the patient's blood pressure, pulse,  and                            oxygen saturations were monitored continuously. The                            [id number]-[id number] ([id number]) Olympus endoscope was introduced                            through the mouth, and advanced to the second part                            of duodenum. The upper GI endoscopy was                            accomplished without difficulty. The patient                            tolerated the procedure well. Scope In: Scope Out: Findings:      The examined esophagus was normal.      The Z-line was regular and was found 42 cm from the incisors.      Localized mild inflammation characterized by congestion (edema) and       erythema was found in the gastric antrum.      The cardia and gastric fundus were normal on retroflexion.      There is no endoscopic evidence of ulceration in the entire examined       stomach.      Patchy mild mucosal changes characterized by congestion and erythema       were found in the duodenal bulb.      The exam of the duodenum was otherwise normal. Impression:               - Normal esophagus.                           - Z-line regular, 42 cm from the incisors.                           - Gastritis.                           - Mucosal changes in the duodenum.                           -  No specimens collected. Moderate Sedation:      N/A - MAC procedure Recommendation:           - NPO until capsule placed.                           - Observe patient's clinical course.                           - To visualize the small bowel, perform video                            capsule endoscopy today. Procedure Code(s):        --- Professional ---                           [phone], Esophagogastroduodenoscopy, flexible,                            transoral; diagnostic, including collection of                            specimen(s) by brushing or washing, when performed                            (separate procedure) Diagnosis Code(s):        ---  Professional ---                           K92.1, Melena (includes Hematochezia)                           K29.70, Gastritis, unspecified, without bleeding                           K31.89, Other diseases of stomach and duodenum CPT copyright 2022 [employer] Association. All rights reserved. The codes documented in this report are preliminary and upon coder review may  be revised to meet current compliance requirements. Jerrell JAYSON Sol, MD 03/02/2024 1:30:26 PM This report has been signed electronically. Number of Addenda: 0

## 2024-03-02 NOTE — Anesthesia Postprocedure Evaluation (Signed)
 Anesthesia Post Note  Patient: Kishon Garriga Isakson  Procedure(s) Performed: EGD (ESOPHAGOGASTRODUODENOSCOPY) IMAGING PROCEDURE, GI TRACT, INTRALUMINAL, VIA CAPSULE     Patient location during evaluation: PACU Anesthesia Type: MAC Level of consciousness: awake and alert Pain management: pain level controlled Vital Signs Assessment: post-procedure vital signs reviewed and stable Respiratory status: spontaneous breathing, nonlabored ventilation, respiratory function stable and patient connected to nasal cannula oxygen Cardiovascular status: stable and blood pressure returned to baseline Postop Assessment: no apparent nausea or vomiting Anesthetic complications: no   There were no known notable events for this encounter.  Last Vitals:  Vitals:   03/02/24 1340 03/02/24 1350  BP: (!) 105/54 111/63  Pulse: 91 62  Resp: (!) 23 17  Temp:    SpO2: 97% 99%    Last Pain:  Vitals:   03/02/24 1350  TempSrc:   PainSc: 0-No pain                 Johnae Friley

## 2024-03-03 DIAGNOSIS — K922 Gastrointestinal hemorrhage, unspecified: Secondary | ICD-10-CM | POA: Diagnosis not present

## 2024-03-03 LAB — CBC
HCT: 25.4 % — ABNORMAL LOW (ref 39.0–52.0)
Hemoglobin: 7.3 g/dL — ABNORMAL LOW (ref 13.0–17.0)
MCH: 21.3 pg — ABNORMAL LOW (ref 26.0–34.0)
MCHC: 28.7 g/dL — ABNORMAL LOW (ref 30.0–36.0)
MCV: 74.3 fL — ABNORMAL LOW (ref 80.0–100.0)
Platelets: 224 10*3/uL (ref 150–400)
RBC: 3.42 MIL/uL — ABNORMAL LOW (ref 4.22–5.81)
RDW: 20.6 % — ABNORMAL HIGH (ref 11.5–15.5)
WBC: 5.6 10*3/uL (ref 4.0–10.5)
nRBC: 0 % (ref 0.0–0.2)

## 2024-03-03 LAB — GLUCOSE, CAPILLARY
Glucose-Capillary: 248 mg/dL — ABNORMAL HIGH (ref 70–99)
Glucose-Capillary: 263 mg/dL — ABNORMAL HIGH (ref 70–99)
Glucose-Capillary: 110 mg/dL — ABNORMAL HIGH (ref 70–99)
Glucose-Capillary: 158 mg/dL — ABNORMAL HIGH (ref 70–99)

## 2024-03-03 MED ORDER — SODIUM CHLORIDE 0.9% FLUSH
3.0000 mL | INTRAVENOUS | Status: DC | PRN
  Administered 2024-03-04: 3 mL via INTRAVENOUS

## 2024-03-03 MED ORDER — SODIUM CHLORIDE (PF) 0.9 % IJ SOLN
INTRAMUSCULAR | Status: AC
  Administered 2024-03-03: 3 mL via INTRAVENOUS
  Filled 2024-03-03: qty 10

## 2024-03-03 MED ORDER — POLYETHYLENE GLYCOL 3350 17 GM/SCOOP PO POWD
119.0000 g | Freq: Once | ORAL | Status: AC
  Administered 2024-03-03: 119 g via ORAL
  Filled 2024-03-03: qty 119

## 2024-03-03 MED ORDER — SODIUM CHLORIDE 0.9% FLUSH
3.0000 mL | Freq: Two times a day (BID) | INTRAVENOUS | Status: DC
  Administered 2024-03-03: 10 mL via INTRAVENOUS
  Administered 2024-03-03 – 2024-03-04 (×2): 3 mL via INTRAVENOUS

## 2024-03-03 MED ORDER — BISACODYL 5 MG PO TBEC
10.0000 mg | DELAYED_RELEASE_TABLET | Freq: Once | ORAL | Status: AC
  Administered 2024-03-03: 10 mg via ORAL
  Filled 2024-03-03: qty 2

## 2024-03-03 MED ORDER — RIVAROXABAN 20 MG PO TABS
20.0000 mg | ORAL_TABLET | Freq: Every day | ORAL | Status: DC
  Administered 2024-03-03: 20 mg via ORAL
  Filled 2024-03-03: qty 1

## 2024-03-03 NOTE — Progress Notes (Signed)
 PHARMACY - ANTICOAGULATION CONSULT NOTE  Pharmacy Consult for Xarelto  Indication: atrial fibrillation  Allergies  Allergen Reactions   Apixaban Nausea And Vomiting and Other (See Comments)    GI Bleeding also   Oseltamivir Phosphate Palpitations and Other (See Comments)    Drowsiness, also     Patient Measurements: Height: 5' 10 (177.8 cm) Weight: 74.9 kg (165 lb 2 oz) IBW/kg (Calculated) : 73 HEPARIN  DW (KG): 72.1  Vital Signs: Temp: 97.5 F (36.4 C) (06/28 0822) Temp Source: Axillary (06/28 0822) BP: 121/67 (06/28 0923) Pulse Rate: 77 (06/28 0900)  Labs: Recent Labs    02/29/24 1918 02/29/24 2052 02/29/24 2052 03/01/24 0142 03/01/24 0550 03/01/24 1100 03/01/24 1454 03/02/24 0924  HGB 6.7*  --   --  6.7* 7.1* 7.4* 8.2* 8.0*  HCT 23.7*  --   --  23.3* 24.3* 25.0* 27.7* 26.3*  PLT 274  --   --   --  233  --   --   --   APTT  --  64*  --   --   --   --   --   --   LABPROT  --  >90.0*   < > 79.1*  --  27.9*  --  21.8*  INR  --  >10.0*   < > 9.4*  --  2.5*  --  1.8*  CREATININE 0.78  --   --   --  0.66  --   --   --    < > = values in this interval not displayed.    Estimated Creatinine Clearance: 84.9 mL/min (by C-G formula based on SCr of 0.66 mg/dL).   Medical History: Past Medical History:  Diagnosis Date   Atrial fibrillation (HCC)    chronic   Carcinoid tumor    Carotid artery dissection (HCC)    History of previous spontaneous carotid artery dissection   Diabetes mellitus    Hyperlipidemia    S/P small bowel resection    History of a partial bowel resection for a carcinoid   Seizure disorder (HCC)     Medications:  Medications Prior to Admission  Medication Sig Dispense Refill Last Dose/Taking   digoxin  (LANOXIN ) 0.125 MG tablet Take 0.125 mg by mouth daily.   02/29/2024 Morning   diltiazem  (TIAZAC ) 180 MG 24 hr capsule Take 180 mg by mouth daily.   02/29/2024 Morning   DULoxetine  (CYMBALTA ) 60 MG capsule Take 60 mg by mouth 2 (two) times  daily.   02/29/2024 Morning   EXCEDRIN MIGRAINE 250-250-65 MG tablet Take 1 tablet by mouth every 6 (six) hours as needed (for headaches).   Unknown   fenofibrate  160 MG tablet Take 160 mg by mouth daily.   02/29/2024 Morning   finasteride  (PROSCAR ) 5 MG tablet Take 5 mg by mouth daily.   02/29/2024 Morning   fluticasone (FLONASE) 50 MCG/ACT nasal spray Place 1 spray into both nostrils 2 (two) times daily as needed for allergies or rhinitis.   Unknown   icosapent Ethyl (VASCEPA) 1 g capsule Take 1 g by mouth 2 (two) times daily.   02/29/2024 Morning   lamoTRIgine  (LAMICTAL  XR) 100 MG 24 hour tablet Take 100 mg by mouth daily.   02/29/2024 Morning   LANTUS  SOLOSTAR 100 UNIT/ML Solostar Pen Inject 25 Units into the skin in the morning.   02/29/2024 Morning   Melatonin 1 MG SUBL Place 1.5 mg under the tongue at bedtime as needed (for sleep).   Unknown   polyethylene glycol (  MIRALAX / GLYCOLAX) 17 g packet Take 17 g by mouth daily as needed for mild constipation.   Unknown   pregabalin  (LYRICA ) 100 MG capsule Take 100 mg by mouth at bedtime.   02/28/2024 Bedtime   ramipril (ALTACE) 5 MG capsule Take 5 mg by mouth daily.   02/29/2024 Morning   SENNA S 8.6-50 MG tablet Take 2 tablets by mouth in the morning.   02/29/2024 Morning   SYNJARDY 01-999 MG TABS Take 1 tablet by mouth in the morning and at bedtime.   02/29/2024 Morning   tamsulosin  (FLOMAX ) 0.4 MG CAPS capsule Take 1 capsule (0.4 mg total) by mouth daily. 30 capsule 0 02/29/2024 Morning   TYLENOL  500 MG tablet Take 500 mg by mouth every 6 (six) hours as needed (for headaches).   Unknown   warfarin (COUMADIN ) 5 MG tablet Adjustment of Coumadin  dose to maintain INR 2-3 (Patient taking differently: Take 5 mg by mouth See admin instructions. Take 5 mg by mouth in the morning on only Tuesdays and Thursdays) 1 tablet 0 02/28/2024 at 11:00 AM   warfarin (COUMADIN ) 7.5 MG tablet Take 7.5 mg by mouth See admin instructions. Take 7.5 mg by mouth in the morning only on  Sun/Mon/Wed/Fri/Sat   02/29/2024 at 11:00 AM   Glucose (TRUEPLUS GLUCOSE) 15 GM/32ML GEL Take 32 mLs by mouth as needed (for hypoglycemia).   Unknown   insulin  glargine (LANTUS ) 100 UNIT/ML injection Inject 0.1 mLs (10 Units total) into the skin at bedtime. (Patient not taking: Reported on 02/29/2024) 10 mL 11 Not Taking   ondansetron  (ZOFRAN -ODT) 4 MG disintegrating tablet Take 1 tablet (4 mg total) by mouth every 8 (eight) hours as needed for nausea or vomiting. 4mg  ODT q4 hours prn nausea/vomit (Patient not taking: Reported on 02/29/2024) 20 tablet 0 Not Taking   pantoprazole  (PROTONIX ) 40 MG tablet Take 1 tablet (40 mg total) by mouth daily. (Patient not taking: Reported on 02/29/2024) 30 tablet 0 Not Taking    Assessment: Pharmacy is consulted to start Xarelto on 73 yo male with hx of afib. Pt had been on warfarin PTA with INR > 10 on presentation to ED. Pt developed GI bleed. Ptis refusing apixaban due to intractable NV with apixaban,   Today, 03/03/24  INR is 1.8 Hgb 8.0 low but stable Plt 233 ( 6/26)  SCr 0.66, CrCl 85 ml/min    Goal of Therapy:  Monitor platelets by anticoagulation protocol: Yes   Plan:  Xarelto 20 mg PO daily with supper Monitor SCr, CBC  Monitor for signs and symptoms of bleeding   Dolphus Roller, PharmD, BCPS 03/03/2024 9:59 AM

## 2024-03-03 NOTE — Plan of Care (Signed)

## 2024-03-03 NOTE — H&P (View-Only) (Signed)
 [facility] Progress Note  SUBJECTIVE:   Interval history: Mitchell Bright was seen and evaluated today at bedside. Videocapsule read this AM, findings of possible small bowel anastomotic ulcer (non-bleeding) and bright red blood upon entering colon in cecum. Patient noted that he had a melanic stool this AM followed by some dark brown stool, no blood. No abdominal pain, no nausea, vomiting, chest pain, shortness of breath. Agreeable to colonoscopy tomorrow.  Past Medical History:  Diagnosis Date   Atrial fibrillation (HCC)    chronic   Carcinoid tumor    Carotid artery dissection (HCC)    History of previous spontaneous carotid artery dissection   Diabetes mellitus    Hyperlipidemia    S/P small bowel resection    History of a partial bowel resection for a carcinoid   Seizure disorder [facility])    Past Surgical History:  Procedure Laterality Date   PARTIAL COLECTOMY     left, sigmoid colectomy by Dr. Curvin in 2005   SMALL INTESTINE SURGERY     Current Facility-Administered Medications  Medication Dose Route Frequency Provider Last Rate Last Admin   0.9 %  sodium chloride  infusion   Intravenous Continuous Dianna Specking, MD   Stopped at 03/03/24 1030   acetaminophen  (TYLENOL ) tablet 650 mg  650 mg Oral Q6H PRN Dianna Specking, MD       Or   acetaminophen  (TYLENOL ) suppository 650 mg  650 mg Rectal Q6H PRN Dianna Specking, MD       Chlorhexidine  Gluconate Cloth 2 % PADS 6 each  6 each Topical Daily Dianna Specking, MD   6 each at 03/02/24 0947   digoxin  (LANOXIN ) tablet 0.125 mg  0.125 mg Oral Daily Dianna Specking, MD   0.125 mg at 03/03/24 9075   diltiazem  (CARDIZEM  CD) 24 hr capsule 180 mg  180 mg Oral Daily Dianna Specking, MD   180 mg at 03/03/24 9076   DULoxetine  (CYMBALTA ) DR capsule 60 mg  60 mg Oral BID Dianna Specking, MD   60 mg at 03/03/24 9074   fenofibrate  tablet 160 mg  160 mg Oral Daily Dianna Specking, MD   160 mg at 03/03/24 9074    finasteride  (PROSCAR ) tablet 5 mg  5 mg Oral Daily Dianna Specking, MD   5 mg at 03/03/24 9075   insulin  aspart (novoLOG ) injection 0-9 Units  0-9 Units Subcutaneous TID WC Dianna Specking, MD   3 Units at 03/03/24 9077   insulin  glargine-yfgn (SEMGLEE ) injection 20 Units  20 Units Subcutaneous Daily Dianna Specking, MD   20 Units at 03/03/24 1029   lamoTRIgine  (LAMICTAL  XR) 24 hour tablet 100 mg  100 mg Oral Daily Dianna Specking, MD   100 mg at 03/03/24 9074   ondansetron  (ZOFRAN ) injection 4 mg  4 mg Intravenous Q6H PRN Dianna Specking, MD       Oral care mouth rinse  15 mL Mouth Rinse PRN Dianna Specking, MD       pantoprazole  (PROTONIX ) injection 40 mg  40 mg Intravenous Q12H Dianna Specking, MD   40 mg at 03/03/24 9076   pregabalin  (LYRICA ) capsule 100 mg  100 mg Oral QHS Dianna Specking, MD   100 mg at 03/02/24 2214   rivaroxaban (XARELTO) tablet 20 mg  20 mg Oral Q supper Lue Elsie BROCKS, MD   20 mg at 03/03/24 1123   tamsulosin  (FLOMAX ) capsule 0.4 mg  0.4 mg Oral Daily Dianna Specking, MD   0.4 mg at 03/03/24 9076   Allergies as of  02/29/2024 - Review Complete 02/29/2024  Allergen Reaction Noted   Apixaban Nausea And Vomiting and Other (See Comments) 04/21/2016   Oseltamivir phosphate Palpitations and Other (See Comments) 08/19/2009   Review of Systems:  Review of Systems  Respiratory:  Negative for shortness of breath.   Cardiovascular:  Negative for chest pain.  Gastrointestinal:  Positive for blood in stool and melena. Negative for abdominal pain, nausea and vomiting.    OBJECTIVE:   Temp:  [97.2 F (36.2 C)-98.2 F (36.8 C)] 97.7 F (36.5 C) (06/28 1127) Pulse Rate:  [54-98] 66 (06/28 1127) Resp:  [13-25] 19 (06/28 1127) BP: (91-138)/(48-89) 109/70 (06/28 1127) SpO2:  [92 %-99 %] 98 % (06/28 1127) Weight:  [74.9 kg] 74.9 kg (06/28 0400) Last BM Date : 03/02/24 Physical Exam Constitutional:      General: He is not in acute distress.     Appearance: He is not ill-appearing, toxic-appearing or diaphoretic.   Cardiovascular:     Rate and Rhythm: Bradycardia present.  Pulmonary:     Effort: No respiratory distress.     Breath sounds: Normal breath sounds.  Abdominal:     General: There is no distension.     Palpations: Abdomen is soft.     Tenderness: There is no abdominal tenderness. There is no guarding.   Musculoskeletal:     Right lower leg: No edema.     Left lower leg: No edema.   Skin:    General: Skin is warm and dry.   Neurological:     Mental Status: He is alert.     Labs: Recent Labs    02/29/24 1918 03/01/24 0142 03/01/24 0550 03/01/24 1100 03/01/24 1454 03/02/24 0924 03/03/24 1002  WBC 11.0*  --  7.3  --   --   --  5.6  HGB 6.7*   < > 7.1*   < > 8.2* 8.0* 7.3*  HCT 23.7*   < > 24.3*   < > 27.7* 26.3* 25.4*  PLT 274  --  233  --   --   --  224   < > = values in this interval not displayed.   BMET Recent Labs    02/29/24 1918 03/01/24 0550  NA 135 135  K 4.4 3.5  CL 105 103  CO2 20* 22  GLUCOSE 193* 73  BUN 24* 22  CREATININE 0.78 0.66  CALCIUM 8.2* 8.6*   LFT Recent Labs    03/01/24 0550  PROT 5.6*  ALBUMIN 3.4*  AST 16  ALT 12  ALKPHOS 29*  BILITOT 0.5   PT/INR Recent Labs    03/01/24 1100 03/02/24 0924  LABPROT 27.9* 21.8*  INR 2.5* 1.8*   Diagnostic imaging: No results found.  IMPRESSION: Melena  -EGD 6/27 with antrum and duodenum inflammation  -Bright red blood noted in colon on video capsule endoscopy Anemia Supratherapeutic INR, now normalized History of sigmoidectomy in 2005 for diverticular bleeding History small bowel resection for carcinoid tumor in distant past  PLAN: -Recommend colonoscopy to further investigate blood noted on video capsule endoscopy, patient and his wife agreeable -Clear liquid diet today, bowel preparation this afternoon -NPO at midnight for procedure 03/04/24   LOS: 2 days   Estefana Keas, DO [facility]  Gastroenterology

## 2024-03-03 NOTE — Progress Notes (Signed)
 [facility] Progress Note  SUBJECTIVE:   Interval history: Mitchell Bright was seen and evaluated today at bedside. Videocapsule read this AM, findings of possible small bowel anastomotic ulcer (non-bleeding) and bright red blood upon entering colon in cecum. Patient noted that he had a melanic stool this AM followed by some dark brown stool, no blood. No abdominal pain, no nausea, vomiting, chest pain, shortness of breath. Agreeable to colonoscopy tomorrow.  Past Medical History:  Diagnosis Date   Atrial fibrillation (HCC)    chronic   Carcinoid tumor    Carotid artery dissection (HCC)    History of previous spontaneous carotid artery dissection   Diabetes mellitus    Hyperlipidemia    S/P small bowel resection    History of a partial bowel resection for a carcinoid   Seizure disorder [facility])    Past Surgical History:  Procedure Laterality Date   PARTIAL COLECTOMY     left, sigmoid colectomy by Dr. Curvin in 2005   SMALL INTESTINE SURGERY     Current Facility-Administered Medications  Medication Dose Route Frequency Provider Last Rate Last Admin   0.9 %  sodium chloride  infusion   Intravenous Continuous Dianna Specking, MD   Stopped at 03/03/24 1030   acetaminophen  (TYLENOL ) tablet 650 mg  650 mg Oral Q6H PRN Dianna Specking, MD       Or   acetaminophen  (TYLENOL ) suppository 650 mg  650 mg Rectal Q6H PRN Dianna Specking, MD       Chlorhexidine  Gluconate Cloth 2 % PADS 6 each  6 each Topical Daily Dianna Specking, MD   6 each at 03/02/24 0947   digoxin  (LANOXIN ) tablet 0.125 mg  0.125 mg Oral Daily Dianna Specking, MD   0.125 mg at 03/03/24 9075   diltiazem  (CARDIZEM  CD) 24 hr capsule 180 mg  180 mg Oral Daily Dianna Specking, MD   180 mg at 03/03/24 9076   DULoxetine  (CYMBALTA ) DR capsule 60 mg  60 mg Oral BID Dianna Specking, MD   60 mg at 03/03/24 9074   fenofibrate  tablet 160 mg  160 mg Oral Daily Dianna Specking, MD   160 mg at 03/03/24 9074    finasteride  (PROSCAR ) tablet 5 mg  5 mg Oral Daily Dianna Specking, MD   5 mg at 03/03/24 9075   insulin  aspart (novoLOG ) injection 0-9 Units  0-9 Units Subcutaneous TID WC Dianna Specking, MD   3 Units at 03/03/24 9077   insulin  glargine-yfgn (SEMGLEE ) injection 20 Units  20 Units Subcutaneous Daily Dianna Specking, MD   20 Units at 03/03/24 1029   lamoTRIgine  (LAMICTAL  XR) 24 hour tablet 100 mg  100 mg Oral Daily Dianna Specking, MD   100 mg at 03/03/24 9074   ondansetron  (ZOFRAN ) injection 4 mg  4 mg Intravenous Q6H PRN Dianna Specking, MD       Oral care mouth rinse  15 mL Mouth Rinse PRN Dianna Specking, MD       pantoprazole  (PROTONIX ) injection 40 mg  40 mg Intravenous Q12H Dianna Specking, MD   40 mg at 03/03/24 9076   pregabalin  (LYRICA ) capsule 100 mg  100 mg Oral QHS Dianna Specking, MD   100 mg at 03/02/24 2214   rivaroxaban (XARELTO) tablet 20 mg  20 mg Oral Q supper Lue Elsie BROCKS, MD   20 mg at 03/03/24 1123   tamsulosin  (FLOMAX ) capsule 0.4 mg  0.4 mg Oral Daily Dianna Specking, MD   0.4 mg at 03/03/24 9076   Allergies as of  02/29/2024 - Review Complete 02/29/2024  Allergen Reaction Noted   Apixaban Nausea And Vomiting and Other (See Comments) 04/21/2016   Oseltamivir phosphate Palpitations and Other (See Comments) 08/19/2009   Review of Systems:  Review of Systems  Respiratory:  Negative for shortness of breath.   Cardiovascular:  Negative for chest pain.  Gastrointestinal:  Positive for blood in stool and melena. Negative for abdominal pain, nausea and vomiting.    OBJECTIVE:   Temp:  [97.2 F (36.2 C)-98.2 F (36.8 C)] 97.7 F (36.5 C) (06/28 1127) Pulse Rate:  [54-98] 66 (06/28 1127) Resp:  [13-25] 19 (06/28 1127) BP: (91-138)/(48-89) 109/70 (06/28 1127) SpO2:  [92 %-99 %] 98 % (06/28 1127) Weight:  [74.9 kg] 74.9 kg (06/28 0400) Last BM Date : 03/02/24 Physical Exam Constitutional:      General: He is not in acute distress.     Appearance: He is not ill-appearing, toxic-appearing or diaphoretic.   Cardiovascular:     Rate and Rhythm: Bradycardia present.  Pulmonary:     Effort: No respiratory distress.     Breath sounds: Normal breath sounds.  Abdominal:     General: There is no distension.     Palpations: Abdomen is soft.     Tenderness: There is no abdominal tenderness. There is no guarding.   Musculoskeletal:     Right lower leg: No edema.     Left lower leg: No edema.   Skin:    General: Skin is warm and dry.   Neurological:     Mental Status: He is alert.     Labs: Recent Labs    02/29/24 1918 03/01/24 0142 03/01/24 0550 03/01/24 1100 03/01/24 1454 03/02/24 0924 03/03/24 1002  WBC 11.0*  --  7.3  --   --   --  5.6  HGB 6.7*   < > 7.1*   < > 8.2* 8.0* 7.3*  HCT 23.7*   < > 24.3*   < > 27.7* 26.3* 25.4*  PLT 274  --  233  --   --   --  224   < > = values in this interval not displayed.   BMET Recent Labs    02/29/24 1918 03/01/24 0550  NA 135 135  K 4.4 3.5  CL 105 103  CO2 20* 22  GLUCOSE 193* 73  BUN 24* 22  CREATININE 0.78 0.66  CALCIUM 8.2* 8.6*   LFT Recent Labs    03/01/24 0550  PROT 5.6*  ALBUMIN 3.4*  AST 16  ALT 12  ALKPHOS 29*  BILITOT 0.5   PT/INR Recent Labs    03/01/24 1100 03/02/24 0924  LABPROT 27.9* 21.8*  INR 2.5* 1.8*   Diagnostic imaging: No results found.  IMPRESSION: Melena  -EGD 6/27 with antrum and duodenum inflammation  -Bright red blood noted in colon on video capsule endoscopy Anemia Supratherapeutic INR, now normalized History of sigmoidectomy in 2005 for diverticular bleeding History small bowel resection for carcinoid tumor in distant past  PLAN: -Recommend colonoscopy to further investigate blood noted on video capsule endoscopy, patient and his wife agreeable -Clear liquid diet today, bowel preparation this afternoon -NPO at midnight for procedure 03/04/24   LOS: 2 days   Estefana Keas, DO [facility]  Gastroenterology

## 2024-03-03 NOTE — Progress Notes (Signed)
 Recorder and leads removed and placed near the bedside sink for endoscopy to pick up in the AM.

## 2024-03-03 NOTE — TOC Initial Note (Signed)
 Transition of Care [facility]) - Initial/Assessment Note    Patient Details  Name: Mitchell Bright MRN: [id number] Date of Birth: [birth year]  [facility] of [facility]) CM/SW Contact:    Sheri ONEIDA Sharps, LCSW Phone Number: 03/03/2024, 8:45 AM  Clinical Narrative:                 Pt from home w/ spouse. Pt continues medical workup. TOC following for dc needs.     Barriers to Discharge: Continued Medical Work up   Patient Goals and CMS Choice Patient states their goals for this hospitalization and ongoing recovery are:: return home CMS Medicare.gov Compare Post Acute Care list provided to::  (NA) Choice offered to / list presented to : NA [place] ownership interest in [facility].provided to::  (NA)    Expected Discharge Plan and Services In-house Referral: NA Discharge Planning Services: NA   Living arrangements for the past 2 months: Single Family Home                 DME Arranged: N/A DME Agency: NA       HH Arranged: NA HH Agency: NA        Prior Living Arrangements/Services Living arrangements for the past 2 months: Single Family Home Lives with:: Spouse Patient language and need for interpreter reviewed:: Yes Do you feel safe going back to the place where you live?: Yes      Need for Family Participation in Patient Care: Yes (Comment) Care giver support system in place?: Yes (comment)   Criminal Activity/Legal Involvement Pertinent to Current Situation/Hospitalization: No - Comment as needed  Activities of Daily Living   ADL Screening (condition at time of admission) Independently performs ADLs?: Yes (appropriate for developmental age) Is the patient deaf or have difficulty hearing?: No Does the patient have difficulty seeing, even when wearing glasses/contacts?: No Does the patient have difficulty concentrating, remembering, or making decisions?: No  Permission Sought/Granted                  Emotional Assessment Appearance:: Appears stated  age Attitude/Demeanor/Rapport: Engaged Affect (typically observed): Accepting Orientation: : Oriented to Self, Oriented to Place, Oriented to  Time Alcohol / Substance Use: Not Applicable Psych Involvement: No (comment)  Admission diagnosis:  Syncope and collapse [R55] Acute blood loss anemia [D62] Acute lower GI bleeding [K92.2] Patient Active Problem List   Diagnosis Date Noted   Melena 03/02/2024   Acute lower GI bleeding 03/01/2024   Hypertension 03/01/2024   Supratherapeutic INR 03/01/2024   ABLA (acute blood loss anemia) 03/01/2024   Iron deficiency anemia due to chronic blood loss 03/01/2024   Mild protein malnutrition (HCC) 03/01/2024   History of TIA (transient ischemic attack) 07/09/2022   SBO (small bowel obstruction) (HCC) 07/08/2022   Type 2 diabetes mellitus (HCC) 07/08/2022   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   Staphylococcus epidermidis bacteremia 06/15/2022   History of lumbar laminectomy for spinal cord decompression 03/25/2022   Degenerative scoliosis 12/31/2021   Hearing loss, bilateral 08/25/2021   ETD (Eustachian tube dysfunction), bilateral 10/29/2019   Rhinitis, chronic 10/29/2019   Diabetic peripheral neuropathy (HCC) 10/22/2015   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) 10/22/2015   Personal history of other diseases of the digestive system 03/20/2015   Rotator cuff syndrome 04/23/2014   Personal history of transient ischemic attack (TIA), and cerebral infarction without residual deficits 12/12/2012   CHEST PAIN-PRECORDIAL 01/29/2010   HYPERCHOLESTEROLEMIA 08/08/2008   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 08/08/2008   ATRIAL FIBRILLATION 08/08/2008   PCP:  Radiontchenko, Alexei, MD Pharmacy:   [facility] [phone] - [place], [place] - [address] AT [facility] OF [address] [place] [address] Phone: [phone] Fax: [phone]     Social Drivers of Health (SDOH) Social  History: SDOH Screenings   Food Insecurity: No Food Insecurity (03/01/2024)  Housing: Low Risk  (03/01/2024)  Transportation Needs: No Transportation Needs (03/01/2024)  Utilities: Not At Risk (03/01/2024)  Financial Resource Strain: Low Risk  (10/23/2023)   Received from [facility]  Physical Activity: Sufficiently Active (10/23/2023)   Received from [facility]  Social Connections: Moderately Isolated (03/01/2024)  Stress: No Stress Concern Present (10/23/2023)   Received from [facility]  Tobacco Use: Low Risk  (02/29/2024)   SDOH Interventions:     Readmission Risk Interventions    03/03/2024    8:44 AM 07/09/2022    2:47 PM  Readmission Risk Prevention Plan  Transportation Screening Complete Complete  PCP or Specialist Appt within 5-7 Days Complete Complete  Home Care Screening Complete Complete  Medication Review (RN CM) Complete Complete

## 2024-03-03 NOTE — Progress Notes (Signed)
 PROGRESS NOTE    ALBERTO PINA  FMW:[id number] DOB: 1951-06-17 DOA: 02/29/2024 PCP: Radiontchenko, Alexei, MD   Brief Narrative:  Mitchell Bright is a 73 y.o. 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, carotid artery dissection, type 2 diabetes, hyperlipidemia, seizure disorder who presented to the emergency department due to loss of consciousness in the setting of recent multiple episodes of melena -hospitalist called in for admission, GI called in consult  Assessment & Plan:   Principal Problem:   Acute lower GI bleeding Active Problems:   HYPERCHOLESTEROLEMIA   ATRIAL FIBRILLATION   Diabetic peripheral neuropathy (HCC)   Type 2 diabetes mellitus (HCC)   Hypertension   Supratherapeutic INR   ABLA (acute blood loss anemia)   Iron deficiency anemia due to chronic blood loss   Mild protein malnutrition (HCC)   Melena   Acute syncopal event secondary to profound anemia  - Resolved with blood transfusion, no further episodes, orthostatics negative, otherwise feels back to baseline  Acute blood loss anemia secondary to acute GI bleed Concurrent chronic iron deficiency anemia due to chronic blood loss Secondary to supratherapeutic INR - Warfarin discontinued -status post IV vitamin K  INR >10 with active bleed - INR downtrending appropriately -no further signs or symptoms of bleeding - GI following - upper endoscopy negative, capsule endoscopy concerning for ongoing bleed  - Plan for colonoscopy in the next 24 hours per GI schedule n.p.o. at midnight,  - PPI ongoing in the setting of exedrine use - Iron panel with remarkably low iron elevated TIBC likely secondary to blood loss  Atrial fibrillation, rate controlled Hold warfarin. Will transition from warfarin to Xarelto at discharge given patient's labile INR as above (reports history of intractable nausea/bleed with Eliquis) Continue  diltiazem  180 mg p.o. daily. Continue digoxin  125 mcg p.o. daily.   Diabetic peripheral neuropathy (HCC) Continue duloxetine  60 mg p.o. twice daily. Continue pregabalin  100 mg p.o. bedtime.   Type 2 diabetes mellitus (HCC) Carbohydrate modified diet. Long-acting insulin  20 units daily Continue sliding scale, hypoglycemic protocol Hemoglobin A1c 7.0   Hypertension Hold ACE inhibitor. On diltiazem  as above.   Hyperlipidemia Continue fenofibrate  160 mg p.o. daily.  Mild protein malnutrition (HCC) Discussed improving diet  DVT prophylaxis: SCDs Start: 03/01/24 0203 Code Status:   Code Status: Full Code Family Communication: Wife at bedside Status is: Inpatient  Dispo: The patient is from: Home              Anticipated d/c is to: Home              Anticipated d/c date is: 24 to 48 hours              Patient currently not medically stable for discharge  Consultants:  GI  Procedures:  Upper endoscopy, capsule endoscopy, colonoscopy  Antimicrobials:  None  Subjective: No acute issues or events overnight denies nausea vomiting diarrhea constipation effusions or chest pain  Objective: Vitals:   03/03/24 0822 03/03/24 0900 03/03/24 0923 03/03/24 1127  BP:  121/67 121/67 109/70  Pulse:  77  66  Resp:  15  19  Temp: (!) 97.5 F (36.4 C)   97.7 F (36.5 C)  TempSrc: Axillary   Oral  SpO2:  96%  98%  Weight:      Height:        Intake/Output Summary (Last 24 hours) at 03/03/2024 1252 Last data filed at 03/03/2024 1126 Gross per  24 hour  Intake 983.57 ml  Output 1250 ml  Net -266.43 ml   Filed Weights   03/02/24 0500 03/02/24 1202 03/03/24 0400  Weight: 72.1 kg 72.1 kg 74.9 kg    Examination:  General:  Pleasantly resting in bed, No acute distress. HEENT:  Normocephalic atraumatic.  Sclerae nonicteric, noninjected.  Extraocular movements intact bilaterally. Neck:  Without mass or deformity.  Trachea is midline. Lungs:  Clear to auscultate bilaterally without  rhonchi, wheeze, or rales. Heart:  Regular rate and rhythm.  Without murmurs, rubs, or gallops. Abdomen:  Soft, nontender, nondistended.  Without guarding or rebound. Extremities: Without cyanosis, clubbing, edema, or obvious deformity. Skin:  Warm and dry, no erythema.  Data Reviewed: I have personally reviewed following labs and imaging studies  CBC: Recent Labs  Lab 02/29/24 1918 03/01/24 0142 03/01/24 0550 03/01/24 1100 03/01/24 1454 03/02/24 0924 03/03/24 1002  WBC 11.0*  --  7.3  --   --   --  5.6  NEUTROABS  --   --  4.4  --   --   --   --   HGB 6.7*   < > 7.1* 7.4* 8.2* 8.0* 7.3*  HCT 23.7*   < > 24.3* 25.0* 27.7* 26.3* 25.4*  MCV 68.1*  --  70.2*  --   --   --  74.3*  PLT 274  --  233  --   --   --  224   < > = values in this interval not displayed.   Basic Metabolic Panel: Recent Labs  Lab 02/29/24 1918 03/01/24 0550  NA 135 135  K 4.4 3.5  CL 105 103  CO2 20* 22  GLUCOSE 193* 73  BUN 24* 22  CREATININE 0.78 0.66  CALCIUM 8.2* 8.6*  MG  --  2.1   GFR: Estimated Creatinine Clearance: 84.9 mL/min (by C-G formula based on SCr of 0.66 mg/dL). Liver Function Tests: Recent Labs  Lab 02/29/24 1918 03/01/24 0550  AST 23 16  ALT 13 12  ALKPHOS 26* 29*  BILITOT 0.8 0.5  PROT 5.6* 5.6*  ALBUMIN 3.3* 3.4*   No results for input(s): LIPASE, AMYLASE in the last 168 hours. No results for input(s): AMMONIA in the last 168 hours. Coagulation Profile: Recent Labs  Lab 02/29/24 2052 03/01/24 0142 03/01/24 1100 03/02/24 0924  INR >10.0* 9.4* 2.5* 1.8*   Cardiac Enzymes: No results for input(s): CKTOTAL, CKMB, CKMBINDEX, TROPONINI in the last 168 hours. BNP (last 3 results) No results for input(s): PROBNP in the last 8760 hours. HbA1C: Recent Labs    03/02/24 0924  HGBA1C 7.0*   CBG: Recent Labs  Lab 03/02/24 1337 03/02/24 1644 03/02/24 2144 03/03/24 0747 03/03/24 1212  GLUCAP 114* 108* 200* 248* 263*   Lipid Profile: No  results for input(s): CHOL, HDL, LDLCALC, TRIG, CHOLHDL, LDLDIRECT in the last 72 hours. Thyroid Function Tests: No results for input(s): TSH, T4TOTAL, FREET4, T3FREE, THYROIDAB in the last 72 hours. Anemia Panel: Recent Labs    03/01/24 1100  FERRITIN 3*  TIBC 521*  IRON 23*   Sepsis Labs: No results for input(s): PROCALCITON, LATICACIDVEN in the last 168 hours.  Recent Results (from the past 240 hours)  MRSA Next Gen by PCR, Nasal     Status: None   Collection Time: 03/01/24 10:37 AM   Specimen: Nasal Mucosa; Nasal Swab  Result Value Ref Range Status   MRSA by PCR Next Gen NOT DETECTED NOT DETECTED Final    Comment: (NOTE) The  GeneXpert MRSA Assay (FDA approved for NASAL specimens only), is one component of a comprehensive MRSA colonization surveillance program. It is not intended to diagnose MRSA infection nor to guide or monitor treatment for MRSA infections. Test performance is not FDA approved in patients less than 57 years old. Performed at [facility], [address], [place], [place] [address]          Radiology Studies: No results found.       Scheduled Meds:  bisacodyl  10 mg Oral Once   Chlorhexidine  Gluconate Cloth  6 each Topical Daily   digoxin   0.125 mg Oral Daily   diltiazem   180 mg Oral Daily   DULoxetine   60 mg Oral BID   fenofibrate   160 mg Oral Daily   finasteride   5 mg Oral Daily   insulin  aspart  0-9 Units Subcutaneous TID WC   insulin  glargine-yfgn  20 Units Subcutaneous Daily   lamoTRIgine   100 mg Oral Daily   pantoprazole  (PROTONIX ) IV  40 mg Intravenous Q12H   polyethylene glycol powder  119 g Oral Once   Followed by   polyethylene glycol powder  119 g Oral Once   pregabalin   100 mg Oral QHS   sodium chloride  flush  3-10 mL Intravenous Q12H   tamsulosin   0.4 mg Oral Daily   Continuous Infusions:  sodium chloride  Stopped (03/03/24 1030)     LOS: 2 days   Time spent:   Elsie JAYSON Montclair, DO [facility]  If 7PM-7AM, please contact night-coverage [email or web address]  03/03/2024, 12:52 PM

## 2024-03-03 NOTE — Discharge Instructions (Signed)
 Information on my medicine - XARELTO (Rivaroxaban)  Why was Xarelto prescribed for you? Xarelto was prescribed for you to reduce the risk of a blood clot forming that can cause a stroke if you have a medical condition called atrial fibrillation (a type of irregular heartbeat).  What do you need to know about xarelto ? Take your Xarelto ONCE DAILY at the same time every day with your evening meal. If you have difficulty swallowing the tablet whole, you may crush it and mix in applesauce just prior to taking your dose.  Take Xarelto exactly as prescribed by your doctor and DO NOT stop taking Xarelto without talking to the doctor who prescribed the medication.  Stopping without other stroke prevention medication to take the place of Xarelto may increase your risk of developing a clot that causes a stroke.  Refill your prescription before you run out.  After discharge, you should have regular check-up appointments with your healthcare provider that is prescribing your Xarelto.  In the future your dose may need to be changed if your kidney function or weight changes by a significant amount.  What do you do if you miss a dose? If you are taking Xarelto ONCE DAILY and you miss a dose, take it as soon as you remember on the same day then continue your regularly scheduled once daily regimen the next day. Do not take two doses of Xarelto at the same time or on the same day.   Important Safety Information A possible side effect of Xarelto is bleeding. You should call your healthcare provider right away if you experience any of the following: ? Bleeding from an injury or your nose that does not stop. ? Unusual colored urine (red or dark brown) or unusual colored stools (red or black). ? Unusual bruising for unknown reasons. ? A serious fall or if you hit your head (even if there is no bleeding).  Some medicines may interact with Xarelto and might increase your risk of bleeding while on  Xarelto. To help avoid this, consult your healthcare provider or pharmacist prior to using any new prescription or non-prescription medications, including herbals, vitamins, non-steroidal anti-inflammatory drugs (NSAIDs) and supplements.  This website has more information on Xarelto: [email or web address].

## 2024-03-04 ENCOUNTER — Inpatient Hospital Stay (HOSPITAL_COMMUNITY): Admitting: Anesthesiology

## 2024-03-04 ENCOUNTER — Encounter (HOSPITAL_COMMUNITY): Payer: Self-pay | Admitting: Internal Medicine

## 2024-03-04 ENCOUNTER — Encounter (HOSPITAL_COMMUNITY)
Admission: EM | Disposition: A | Discharge status: Home / Self Care | Payer: Self-pay | Source: Home / Self Care | Attending: Internal Medicine

## 2024-03-04 DIAGNOSIS — K920 Hematemesis: Secondary | ICD-10-CM | POA: Diagnosis not present

## 2024-03-04 DIAGNOSIS — K640 First degree hemorrhoids: Secondary | ICD-10-CM

## 2024-03-04 DIAGNOSIS — I10 Essential (primary) hypertension: Secondary | ICD-10-CM | POA: Diagnosis not present

## 2024-03-04 DIAGNOSIS — I4891 Unspecified atrial fibrillation: Secondary | ICD-10-CM | POA: Diagnosis not present

## 2024-03-04 DIAGNOSIS — K922 Gastrointestinal hemorrhage, unspecified: Secondary | ICD-10-CM | POA: Diagnosis not present

## 2024-03-04 DIAGNOSIS — E7800 Pure hypercholesterolemia, unspecified: Secondary | ICD-10-CM

## 2024-03-04 HISTORY — PX: COLONOSCOPY: SHX5424

## 2024-03-04 LAB — TYPE AND SCREEN
ABO/RH(D): O POS
Antibody Screen: NEGATIVE
Unit division: 0
Unit division: 0
Unit division: 0
Unit division: 0

## 2024-03-04 LAB — BASIC METABOLIC PANEL WITH GFR
Anion gap: 9 (ref 5–15)
BUN: 17 mg/dL (ref 8–23)
CO2: 24 mmol/L (ref 22–32)
Calcium: 8.5 mg/dL — ABNORMAL LOW (ref 8.9–10.3)
Chloride: 102 mmol/L (ref 98–111)
Creatinine, Ser: 0.63 mg/dL (ref 0.61–1.24)
GFR, Estimated: >60 mL/min (ref >60)
Glucose, Bld: 138 mg/dL — ABNORMAL HIGH (ref 70–99)
Potassium: 3.8 mmol/L (ref 3.5–5.1)
Sodium: 135 mmol/L (ref 135–145)

## 2024-03-04 LAB — BPAM RBC
Blood Product Expiration Date: 202507252359
Blood Product Expiration Date: 202507252359
Blood Product Expiration Date: 202507252359
Blood Product Expiration Date: 202507272359
ISSUE DATE / TIME: 202506252242
ISSUE DATE / TIME: 202506261126
Unit Type and Rh: 5100
Unit Type and Rh: 5100
Unit Type and Rh: 5100
Unit Type and Rh: 5100

## 2024-03-04 LAB — CBC
HCT: 25.4 % — ABNORMAL LOW (ref 39.0–52.0)
Hemoglobin: 7.5 g/dL — ABNORMAL LOW (ref 13.0–17.0)
MCH: 21.3 pg — ABNORMAL LOW (ref 26.0–34.0)
MCHC: 29.5 g/dL — ABNORMAL LOW (ref 30.0–36.0)
MCV: 72.2 fL — ABNORMAL LOW (ref 80.0–100.0)
Platelets: 263 10*3/uL (ref 150–400)
RBC: 3.52 MIL/uL — ABNORMAL LOW (ref 4.22–5.81)
RDW: 20.8 % — ABNORMAL HIGH (ref 11.5–15.5)
WBC: 5.9 10*3/uL (ref 4.0–10.5)
nRBC: 0 % (ref 0.0–0.2)

## 2024-03-04 LAB — GLUCOSE, CAPILLARY
Glucose-Capillary: 112 mg/dL — ABNORMAL HIGH (ref 70–99)
Glucose-Capillary: 125 mg/dL — ABNORMAL HIGH (ref 70–99)

## 2024-03-04 SURGERY — COLONOSCOPY
Anesthesia: Monitor Anesthesia Care

## 2024-03-04 MED ORDER — PROPOFOL 1000 MG/100ML IV EMUL
INTRAVENOUS | Status: AC
Start: 2024-03-04 — End: 2024-03-04
  Filled 2024-03-04: qty 300

## 2024-03-04 MED ORDER — MIDAZOLAM HCL 2 MG/2ML IJ SOLN
INTRAMUSCULAR | Status: AC
  Filled 2024-03-04: qty 2

## 2024-03-04 MED ORDER — PHENYLEPHRINE 80 MCG/ML (10ML) SYRINGE FOR IV PUSH (FOR BLOOD PRESSURE SUPPORT)
PREFILLED_SYRINGE | INTRAVENOUS | Status: DC | PRN
Start: 2024-03-04 — End: 2024-03-04
  Administered 2024-03-04 (×4): 80 ug via INTRAVENOUS

## 2024-03-04 MED ORDER — ACETAMINOPHEN 325 MG PO TABS: 325.0000 mg | ORAL_TABLET | Freq: Four times a day (QID) | ORAL | 0 refills | Status: AC | PRN

## 2024-03-04 MED ORDER — PROPOFOL 10 MG/ML IV BOLUS
INTRAVENOUS | Status: DC | PRN
  Administered 2024-03-04: 20 mg via INTRAVENOUS

## 2024-03-04 MED ORDER — RIVAROXABAN 20 MG PO TABS: 20.0000 mg | ORAL_TABLET | Freq: Every day | ORAL | Status: DC

## 2024-03-04 MED ORDER — PROPOFOL 500 MG/50ML IV EMUL
INTRAVENOUS | Status: DC | PRN
  Administered 2024-03-04: 200 ug/kg/min via INTRAVENOUS

## 2024-03-04 MED ORDER — MIDAZOLAM HCL 2 MG/2ML IJ SOLN
INTRAMUSCULAR | Status: DC | PRN
  Administered 2024-03-04: 2 mg via INTRAVENOUS

## 2024-03-04 MED ORDER — PANTOPRAZOLE SODIUM 40 MG PO TBEC: 40.0000 mg | DELAYED_RELEASE_TABLET | Freq: Every day | ORAL | 0 refills | Status: AC

## 2024-03-04 MED ORDER — LACTATED RINGERS IV SOLN: INTRAVENOUS | Status: DC | PRN

## 2024-03-04 MED ORDER — LIDOCAINE 2% (20 MG/ML) 5 ML SYRINGE
INTRAMUSCULAR | Status: DC | PRN
  Administered 2024-03-04: 40 mg via INTRAVENOUS

## 2024-03-04 MED ORDER — RIVAROXABAN 20 MG PO TABS
20.0000 mg | ORAL_TABLET | Freq: Every day | ORAL | 0 refills | Status: AC
Start: 2024-03-04 — End: ?

## 2024-03-04 NOTE — Discharge Summary (Signed)
 Physician Discharge Summary  Mitchell Bright FMW:[id number] DOB: 1951/02/27 DOA: 02/29/2024  PCP: Radiontchenko, Alexei, MD  Admit date: 02/29/2024 Discharge date: 03/04/2024  Admitted From: Home Disposition:  Home  Recommendations for Outpatient Follow-up:  Follow up with PCP in 1-2 weeks  Home Health:None  Equipment/Devices:None  Discharge Condition:Stable  CODE STATUS:Full  Diet recommendation: Regular diet as tolerated   Brief/Interim Summary: Mitchell Bright is a 73 y.o. 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  Patient admitted as above with acute GI bleed in the setting of markedly elevated INR. Upper and capsule endoscopy - colonoscopy without clear signs of bleeding but suspected to be from small bowel anastomotic ulcer.  Given stable hgb and no further signs/symptoms of bleeding will transition the patient to xarelto for ongoing anticoagulation given profoundly labile INR. Follow up with primary team closely.   Discharge Diagnoses:  Principal Problem:   Acute lower GI bleeding Active Problems:   HYPERCHOLESTEROLEMIA   ATRIAL FIBRILLATION   Diabetic peripheral neuropathy (HCC)   Type 2 diabetes mellitus (HCC)   Hypertension   Supratherapeutic INR   ABLA (acute blood loss anemia)   Iron deficiency anemia due to chronic blood loss   Mild protein malnutrition (HCC)   Melena  Acute syncopal event secondary to profound anemia  - Resolved with blood transfusion, no further episodes, orthostatics negative, otherwise feels back to baseline   Acute blood loss anemia secondary to acute GI bleed Concurrent chronic iron deficiency  anemia due to chronic blood loss Secondary to supratherapeutic INR - Warfarin discontinued -status post IV vitamin K  INR >10 with active bleed - INR downtrending appropriately -no further signs or symptoms of bleeding - Transition to xarelto - PPI ongoing in the setting of exedrine use - Iron panel with remarkably low iron elevated TIBC likely secondary to blood loss   Atrial fibrillation, rate controlled Hold warfarin. Will transition from warfarin to Xarelto at discharge given patient's labile INR as above (reports history of intractable nausea/bleed with Eliquis) Continue diltiazem  180 mg p.o. daily. Continue digoxin  125 mcg p.o. daily.   Diabetic peripheral neuropathy (HCC) Continue duloxetine  60 mg p.o. twice daily. Continue pregabalin  100 mg p.o. bedtime.   Type 2 diabetes mellitus, uncontrolled with hyperglycemia Carbohydrate modified diet. Long-acting insulin  20 units daily Continue sliding scale, hypoglycemic protocol Hemoglobin A1c 7.0   Hypertension Hold ACE inhibitor. On diltiazem  as above.   Hyperlipidemia Continue fenofibrate  160 mg p.o. daily.   Mild protein malnutrition (HCC) Discussed improving diet  Discharge Instructions  Discharge Instructions     Call MD for:  difficulty breathing, headache or visual disturbances   Complete by: As directed    Call MD for:  extreme fatigue   Complete by: As directed    Call MD for:  hives   Complete by: As directed    Call MD for:  persistant dizziness or light-headedness   Complete by: As directed    Call MD for:  persistant nausea and vomiting   Complete by: As directed    Call MD for:  severe uncontrolled pain   Complete by: As directed    Call MD for:  temperature >100.4   Complete by: As directed    Diet -  low sodium heart healthy   Complete by: As directed    Increase activity slowly   Complete by: As directed       Allergies as of 03/04/2024       Reactions   Apixaban Nausea And Vomiting, Other  (See Comments)   GI Bleeding also   Oseltamivir Phosphate Palpitations, Other (See Comments)   Drowsiness, also        Medication List     STOP taking these medications    Excedrin Migraine 250-250-65 MG tablet Generic drug: aspirin-acetaminophen -caffeine   ondansetron  4 MG disintegrating tablet Commonly known as: ZOFRAN -ODT   warfarin 5 MG tablet Commonly known as: COUMADIN    warfarin 7.5 MG tablet Commonly known as: COUMADIN        TAKE these medications    acetaminophen  325 MG tablet Commonly known as: TYLENOL  Take 1 tablet (325 mg total) by mouth every 6 (six) hours as needed for mild pain (pain score 1-3) (or Fever >/= 101). What changed:  medication strength how much to take reasons to take this   digoxin  0.125 MG tablet Commonly known as: LANOXIN  Take 0.125 mg by mouth daily.   diltiazem  180 MG 24 hr capsule Commonly known as: TIAZAC  Take 180 mg by mouth daily.   DULoxetine  60 MG capsule Commonly known as: CYMBALTA  Take 60 mg by mouth 2 (two) times daily.   fenofibrate  160 MG tablet Take 160 mg by mouth daily.   finasteride  5 MG tablet Commonly known as: PROSCAR  Take 5 mg by mouth daily.   fluticasone 50 MCG/ACT nasal spray Commonly known as: FLONASE Place 1 spray into both nostrils 2 (two) times daily as needed for allergies or rhinitis.   icosapent Ethyl 1 g capsule Commonly known as: VASCEPA Take 1 g by mouth 2 (two) times daily.   LaMICtal  XR 100 MG 24 hour tablet Generic drug: lamoTRIgine  Take 100 mg by mouth daily.   Lantus  SoloStar 100 UNIT/ML Solostar Pen Generic drug: insulin  glargine Inject 25 Units into the skin in the morning. What changed: Another medication with the same name was removed. Continue taking this medication, and follow the directions you see here.   Melatonin 1 MG Subl Place 1.5 mg under the tongue at bedtime as needed (for sleep).   pantoprazole  40 MG tablet Commonly known as: PROTONIX  Take 1 tablet (40  mg total) by mouth daily.   polyethylene glycol 17 g packet Commonly known as: MIRALAX / GLYCOLAX Take 17 g by mouth daily as needed for mild constipation.   pregabalin  100 MG capsule Commonly known as: LYRICA  Take 100 mg by mouth at bedtime.   ramipril 5 MG capsule Commonly known as: ALTACE Take 5 mg by mouth daily.   rivaroxaban 20 MG Tabs tablet Commonly known as: XARELTO Take 1 tablet (20 mg total) by mouth daily with supper.   Senna S 8.6-50 MG tablet Generic drug: senna-docusate Take 2 tablets by mouth in the morning.   Synjardy 01-999 MG Tabs Generic drug: Empagliflozin-metFORMIN HCl Take 1 tablet by mouth in the morning and at bedtime.   tamsulosin  0.4 MG Caps capsule Commonly known as: FLOMAX  Take 1 capsule (0.4 mg total) by mouth daily.   TRUEplus Glucose 15 GM/32ML Gel Generic drug: Glucose Take 32 mLs by mouth as needed (for hypoglycemia).        Allergies  Allergen Reactions   Apixaban Nausea And Vomiting and Other (See Comments)    GI Bleeding also   Oseltamivir Phosphate Palpitations and Other (See  Comments)    Drowsiness, also     Consultations: GI   Procedures/Studies: CT ANGIO GI BLEED Result Date: 03/01/2024 CLINICAL DATA:  Dizziness and diaphoresis, recent history of dark stools EXAM: CTA ABDOMEN AND PELVIS WITHOUT AND WITH CONTRAST TECHNIQUE: Multidetector CT imaging of the abdomen and pelvis was performed using the standard protocol during bolus administration of intravenous contrast. Multiplanar reconstructed images and MIPs were obtained and reviewed to evaluate the vascular anatomy. RADIATION DOSE REDUCTION: This exam was performed according to the departmental dose-optimization program which includes automated exposure control, adjustment of the mA and/or kV according to patient size and/or use of iterative reconstruction technique. CONTRAST:  OMNIPAQUE  IOHEXOL  350 MG/ML SOLN COMPARISON:  None Available. FINDINGS: VASCULAR Aorta:  Abdominal aorta demonstrates atherosclerotic calcifications and mild tortuosity. No aneurysmal dilatation is seen. Celiac: Patent without evidence of aneurysm, dissection, vasculitis or significant stenosis. SMA: Patent without evidence of aneurysm, dissection, vasculitis or significant stenosis. Renals: Dual renal arteries are identified bilaterally. No focal stenosis is seen. IMA: Patent without evidence of aneurysm, dissection, vasculitis or significant stenosis. Inflow: Iliacs demonstrate mild calcification without acute abnormality. Veins: No specific venous abnormality is seen. Review of the MIP images confirms the above findings. NON-VASCULAR Lower chest: No acute abnormality. Hepatobiliary: Gallstone is noted in the dependent portion of the gallbladder. No complicating factors are seen. The liver is within normal limits. Pancreas: Unremarkable. No pancreatic ductal dilatation or surrounding inflammatory changes. Spleen: Normal in size without focal abnormality. Adrenals/Urinary Tract: Adrenal glands are within normal limits. Kidneys demonstrate a normal enhancement pattern bilaterally. No renal calculi or obstructive changes are seen. The bladder is well distended. The prostate indents upon the inferior aspect of the bladder. Stomach/Bowel: Obstructive or inflammatory changes of the colon are seen. The appendix is within normal limits. The stomach and small bowel show no findings to suggest acute GI hemorrhage. No delayed pooling of contrast is seen. Postsurgical changes in the small bowel noted. Lymphatic: No significant lymphadenopathy is noted. Reproductive: Prostate is enlarged indenting upon the inferior aspect of the bladder. Other: No abdominal wall hernia or abnormality. No abdominopelvic ascites. Musculoskeletal: Degenerative changes of lumbar spine are noted. No acute abnormality is seen. IMPRESSION: VASCULAR Scattered atherosclerotic calcifications without acute abnormality. No findings to suggest  acute GI hemorrhage are noted. NON-VASCULAR Cholelithiasis without complicating factors. Prominent prostate indenting upon the inferior aspect of the bladder. Electronically Signed   By: Oneil Devonshire M.D.   On: 03/01/2024 00:32     Subjective: No acute issues/events overnight   Discharge Exam: Vitals:   03/04/24 1318 03/04/24 1400  BP:  (!) 132/58  Pulse:  71  Resp:  16  Temp: 97.6 F (36.4 C)   SpO2:  98%   Vitals:   03/04/24 1250 03/04/24 1300 03/04/24 1318 03/04/24 1400  BP: 133/82 108/67  (!) 132/58  Pulse: 64 71  71  Resp:  14  16  Temp:   97.6 F (36.4 C)   TempSrc:   Oral   SpO2:  94%  98%  Weight:      Height:        General: Pt is alert, awake, not in acute distress Cardiovascular: RRR, S1/S2 +, no rubs, no gallops Respiratory: CTA bilaterally, no wheezing, no rhonchi Abdominal: Soft, NT, ND, bowel sounds + Extremities: no edema, no cyanosis    The results of significant diagnostics from this hospitalization (including imaging, microbiology, ancillary and laboratory) are listed below for reference.     Microbiology: Recent Results (  from the past 240 hours)  MRSA Next Gen by PCR, Nasal     Status: None   Collection Time: 03/01/24 10:37 AM   Specimen: Nasal Mucosa; Nasal Swab  Result Value Ref Range Status   MRSA by PCR Next Gen NOT DETECTED NOT DETECTED Final    Comment: (NOTE) The GeneXpert MRSA Assay (FDA approved for NASAL specimens only), is one component of a comprehensive MRSA colonization surveillance program. It is not intended to diagnose MRSA infection nor to guide or monitor treatment for MRSA infections. Test performance is not FDA approved in patients less than 4 years old. Performed at [facility], [address] [place], [place] [address]      Labs: BNP (last 3 results) No results for input(s): BNP in the last 8760 hours. Basic Metabolic Panel: Recent Labs  Lab 02/29/24 1918 03/01/24 0550 03/04/24 0045   NA 135 135 135  K 4.4 3.5 3.8  CL 105 103 102  CO2 20* 22 24  GLUCOSE 193* 73 138*  BUN 24* 22 17  CREATININE 0.78 0.66 0.63  CALCIUM 8.2* 8.6* 8.5*  MG  --  2.1  --    Liver Function Tests: Recent Labs  Lab 02/29/24 1918 03/01/24 0550  AST 23 16  ALT 13 12  ALKPHOS 26* 29*  BILITOT 0.8 0.5  PROT 5.6* 5.6*  ALBUMIN 3.3* 3.4*   No results for input(s): LIPASE, AMYLASE in the last 168 hours. No results for input(s): AMMONIA in the last 168 hours. CBC: Recent Labs  Lab 02/29/24 1918 03/01/24 0142 03/01/24 0550 03/01/24 1100 03/01/24 1454 03/02/24 0924 03/03/24 1002 03/04/24 0045  WBC 11.0*  --  7.3  --   --   --  5.6 5.9  NEUTROABS  --   --  4.4  --   --   --   --   --   HGB 6.7*   < > 7.1* 7.4* 8.2* 8.0* 7.3* 7.5*  HCT 23.7*   < > 24.3* 25.0* 27.7* 26.3* 25.4* 25.4*  MCV 68.1*  --  70.2*  --   --   --  74.3* 72.2*  PLT 274  --  233  --   --   --  224 263   < > = values in this interval not displayed.   Cardiac Enzymes: No results for input(s): CKTOTAL, CKMB, CKMBINDEX, TROPONINI in the last 168 hours. BNP: Invalid input(s): POCBNP CBG: Recent Labs  Lab 03/03/24 1212 03/03/24 1617 03/03/24 2234 03/04/24 0742 03/04/24 1201  GLUCAP 263* 110* 158* 112* 125*   D-Dimer No results for input(s): DDIMER in the last 72 hours. Hgb A1c Recent Labs    03/02/24 0924  HGBA1C 7.0*   Lipid Profile No results for input(s): CHOL, HDL, LDLCALC, TRIG, CHOLHDL, LDLDIRECT in the last 72 hours. Thyroid function studies No results for input(s): TSH, T4TOTAL, T3FREE, THYROIDAB in the last 72 hours.  Invalid input(s): FREET3 Anemia work up No results for input(s): VITAMINB12, FOLATE, FERRITIN, TIBC, IRON, RETICCTPCT in the last 72 hours.  Urinalysis    Component Value Date/Time   COLORURINE STRAW (A) 02/29/2024 2140   APPEARANCEUR CLEAR 02/29/2024 2140   LABSPEC 1.025 02/29/2024 2140   PHURINE 5.0 02/29/2024 2140    GLUCOSEU >=500 (A) 02/29/2024 2140   HGBUR NEGATIVE 02/29/2024 2140   BILIRUBINUR NEGATIVE 02/29/2024 2140   KETONESUR 5 (A) 02/29/2024 2140   PROTEINUR NEGATIVE 02/29/2024 2140   NITRITE NEGATIVE 02/29/2024 2140   LEUKOCYTESUR NEGATIVE 02/29/2024 2140  Sepsis Labs Recent Labs  Lab 02/29/24 1918 03/01/24 0550 03/03/24 1002 03/04/24 0045  WBC 11.0* 7.3 5.6 5.9   Microbiology Recent Results (from the past 240 hours)  MRSA Next Gen by PCR, Nasal     Status: None   Collection Time: 03/01/24 10:37 AM   Specimen: Nasal Mucosa; Nasal Swab  Result Value Ref Range Status   MRSA by PCR Next Gen NOT DETECTED NOT DETECTED Final    Comment: (NOTE) The GeneXpert MRSA Assay (FDA approved for NASAL specimens only), is one component of a comprehensive MRSA colonization surveillance program. It is not intended to diagnose MRSA infection nor to guide or monitor treatment for MRSA infections. Test performance is not FDA approved in patients less than 48 years old. Performed at [facility], [address], [place], [place] [address]      Time coordinating discharge: Over 30 minutes  SIGNED:   Elsie JAYSON Montclair, DO [facility] 03/04/2024, 2:27 PM Pager   If 7PM-7AM, please contact night-coverage [email or web address]

## 2024-03-04 NOTE — Anesthesia Preprocedure Evaluation (Addendum)
 Anesthesia Evaluation  Patient identified by MRN, date of birth, ID band Patient awake    Reviewed: Allergy & Precautions, NPO status , Patient's Chart, lab work & pertinent test results  History of Anesthesia Complications (+) history of anesthetic complications (recall during an endoscopy in 2016)  Airway Mallampati: III  TM Distance: >3 FB Neck ROM: Full    Dental  (+) Dental Advisory Given   Pulmonary neg pulmonary ROS   Pulmonary exam normal breath sounds clear to auscultation       Cardiovascular hypertension, Pt. on medications (-) angina (-) Past MI, (-) Cardiac Stents and (-) CABG + dysrhythmias (RBBB) Atrial Fibrillation  Rhythm:Regular Rate:Normal  H/o carotid artery dissection, HLD   Neuro/Psych Seizures - (last seizure >20 years ago), Well Controlled,  TIA Neuromuscular disease (neuropathy)    GI/Hepatic Neg liver ROS, Bowel prep,GERD  Medicated,,S/p small bowel resection for carcinoid tumor   Endo/Other  diabetes, Type 2, Insulin  Dependent    Renal/GU negative Renal ROS     Musculoskeletal Scoliosis    Abdominal   Peds  Hematology  (+) Blood dyscrasia, anemia Lab Results      Component                Value               Date                      WBC                      5.9                 03/04/2024                HGB                      7.5 (L)             03/04/2024                HCT                      25.4 (L)            03/04/2024                MCV                      72.2 (L)            03/04/2024                PLT                      263                 03/04/2024              Anesthesia Other Findings Last Synjardy: 02/29/2024  Last warfarin: 02/29/2024  Reproductive/Obstetrics                             Anesthesia Physical Anesthesia Plan  ASA: 3  Anesthesia Plan: MAC   Post-op Pain Management: Minimal or no pain anticipated   Induction:  Intravenous  PONV Risk Score and Plan: 1 and Propofol infusion, TIVA and Treatment may vary due to age or medical condition  Airway Management Planned: Natural Airway and Nasal Cannula  Additional Equipment:   Intra-op Plan:   Post-operative Plan:   Informed Consent: I have reviewed the patients History and Physical, chart, labs and discussed the procedure including the risks, benefits and alternatives for the proposed anesthesia with the patient or authorized representative who has indicated his/her understanding and acceptance.     Dental advisory given  Plan Discussed with: CRNA and Anesthesiologist  Anesthesia Plan Comments: (Discussed with patient risks of MAC including, but not limited to, minor pain or discomfort, hearing people in the room, and possible need for backup general anesthesia. Risks for general anesthesia also discussed including, but not limited to, sore throat, hoarse voice, chipped/damaged teeth, injury to vocal cords, nausea and vomiting, allergic reactions, lung infection, heart attack, stroke, and death. All questions answered. )        Anesthesia Quick Evaluation

## 2024-03-04 NOTE — Transfer of Care (Signed)
 Immediate Anesthesia Transfer of Care Note  Patient: Mitchell Bright  Procedure(s) Performed: COLONOSCOPY  Patient Location: PACU  Anesthesia Type:MAC  Level of Consciousness: sedated  Airway & Oxygen Therapy: Patient Spontanous Breathing and Patient connected to face mask oxygen  Post-op Assessment: Report given to RN and Post -op Vital signs reviewed and stable  Post vital signs: Reviewed and stable  Last Vitals:  Vitals Value Taken Time  BP 108/74 03/04/24 09:27  Temp 36.7 C 03/04/24 09:27  Pulse 79 03/04/24 09:28  Resp 16 03/04/24 09:27  SpO2 100 % 03/04/24 09:28  Vitals shown include unfiled device data.  Last Pain:  Vitals:   03/04/24 0832  TempSrc: Temporal  PainSc: 0-No pain         Complications: No notable events documented.

## 2024-03-04 NOTE — Plan of Care (Signed)
  Problem: Coping: Goal: Ability to adjust to condition or change in health will improve Outcome: Progressing   Problem: Fluid Volume: Goal: Ability to maintain a balanced intake and output will improve Outcome: Progressing   Problem: Metabolic: Goal: Ability to maintain appropriate glucose levels will improve Outcome: Progressing   Problem: Education: Goal: Knowledge of General Education information will improve Description: Including pain rating scale, medication(s)/side effects and non-pharmacologic comfort measures Outcome: Progressing   Problem: Health Behavior/Discharge Planning: Goal: Ability to manage health-related needs will improve Outcome: Progressing   Problem: Clinical Measurements: Goal: Will remain free from infection Outcome: Progressing Goal: Diagnostic test results will improve Outcome: Progressing Goal: Cardiovascular complication will be avoided Outcome: Progressing   Problem: Coping: Goal: Level of anxiety will decrease Outcome: Progressing   Problem: Elimination: Goal: Will not experience complications related to bowel motility Outcome: Progressing

## 2024-03-04 NOTE — Op Note (Signed)
 [facility] Patient Name: Mitchell Bright Procedure Date: 03/04/2024 MRN: [id number] Attending MD: Estefana Keas DO, DO, [id number] Date of Birth: 01-07-1951 CSN: [id number] Age: 73 Admit Type: Inpatient Procedure:                Colonoscopy Indications:              Hematochezia, Abnormal video capsule endoscopy Providers:                Estefana Keas DO, DO, Almarie Pizza, RN, Felice Sar, Technician Referring MD:              Medicines:                See the Anesthesia note for documentation of the                            administered medications Complications:            No immediate complications. Estimated Blood Loss:     Estimated blood loss: none. Procedure:                Pre-Anesthesia Assessment:                           - ASA Grade Assessment: III - A patient with severe                            systemic disease.                           - The risks and benefits of the procedure and the                            sedation options and risks were discussed with the                            patient. All questions were answered and informed                            consent was obtained.                           After obtaining informed consent, the colonoscope                            was passed under direct vision. Throughout the                            procedure, the patient's blood pressure, pulse, and                            oxygen saturations were monitored continuously. The                            PCF-HQ190L ([id number]) Olympus colonoscope was  introduced through the anus and advanced to the the                            terminal ileum, with identification of the                            appendiceal orifice and IC valve. The colonoscopy                            was performed without difficulty. The patient                            tolerated the procedure well. The  quality of the                            bowel preparation was evaluated using the BBPS                            [facility] Bowel Preparation Scale) with scores of:                            Right Colon = 2 (minor amount of residual staining,                            small fragments of stool and/or opaque liquid, but                            mucosa seen well), Transverse Colon = 2 (minor                            amount of residual staining, small fragments of                            stool and/or opaque liquid, but mucosa seen well)                            and Left Colon = 2 (minor amount of residual                            staining, small fragments of stool and/or opaque                            liquid, but mucosa seen well). The total BBPS score                            equals 6. The quality of the bowel preparation was                            inadequate. Scope In: 8:51:02 AM Scope Out: 9:18:57 AM Scope Withdrawal Time: 0 hours 24 minutes 14 seconds  Total Procedure Duration: 0 hours 27 minutes 55 seconds  Findings:      The perianal exam findings include internal hemorrhoids (Grade  I).      There is no endoscopic evidence of bleeding or mass in the entire colon.      There was evidence of a prior end-to-end colo-colonic anastomosis in the       sigmoid colon. This was patent and was characterized by healthy       appearing mucosa. The anastomosis was traversed.      The terminal ileum appeared normal. Impression:               - Preparation of the colon was inadequate.                           - Internal hemorrhoids (Grade I) found on perianal                            exam.                           - Patent end-to-end colo-colonic anastomosis,                            characterized by healthy appearing mucosa.                           - The examined portion of the ileum was normal.                           - No specimens collected. Moderate Sedation:       See the other procedure note for documentation of moderate sedation with       intraservice time. Recommendation:           - Return patient to hospital ward for ongoing care.                           - Resume previous diet.                           - Continue present medications.                           - Suspect melena from small bowel anastomotic ulcer                            noted on video capsule endoscopy, no signs of                            active bleeding on colonoscopy today (though prep                            was inadequate).                           - If further melena were to occur, may benefit from                            tagged RBC scan to pinpoint site of small  intestinal bleeding. Procedure Code(s):        --- Professional ---                           [phone], Colonoscopy, flexible; diagnostic, including                            collection of specimen(s) by brushing or washing,                            when performed (separate procedure) Diagnosis Code(s):        --- Professional ---                           K64.0, First degree hemorrhoids                           Z98.0, Intestinal bypass and anastomosis status                           K92.1, Melena (includes Hematochezia)                           R93.3, Abnormal findings on diagnostic imaging of                            other parts of digestive tract CPT copyright 2022 [employer]. All rights reserved. The codes documented in this report are preliminary and upon coder review may  be revised to meet current compliance requirements. Dr Estefana Keas, DO Estefana Keas DO, DO 03/04/2024 9:27:03 AM Number of Addenda: 0

## 2024-03-04 NOTE — Anesthesia Postprocedure Evaluation (Signed)
 Anesthesia Post Note  Patient: Mitchell Bright  Procedure(s) Performed: COLONOSCOPY     Patient location during evaluation: PACU Anesthesia Type: MAC Level of consciousness: awake Pain management: pain level controlled Vital Signs Assessment: post-procedure vital signs reviewed and stable Respiratory status: spontaneous breathing, nonlabored ventilation and respiratory function stable Cardiovascular status: stable and blood pressure returned to baseline Postop Assessment: no apparent nausea or vomiting Anesthetic complications: no   No notable events documented.  Last Vitals:  Vitals:   03/04/24 0945 03/04/24 1010  BP: 117/70 126/76  Pulse: (!) 59 66  Resp: 10 14  Temp:    SpO2: 100% 94%    Last Pain:  Vitals:   03/04/24 1010  TempSrc:   PainSc: 0-No pain                 Delon Aisha Arch

## 2024-03-04 NOTE — Interval H&P Note (Signed)
 History and Physical Interval Note:  03/04/2024 8:40 AM  Mitchell Bright  has presented today for surgery, with the diagnosis of Melena, abnormal video capsule endoscopy.  The various methods of treatment have been discussed with the patient and family. After consideration of risks, benefits and other options for treatment, the patient has consented to  Procedure(s): COLONOSCOPY (N/A) as a surgical intervention.  The patient's history has been reviewed, patient examined, no change in status, stable for surgery.  I have reviewed the patient's chart and labs.  Questions were answered to the patient's satisfaction.     [name]

## 2024-03-05 ENCOUNTER — Encounter (HOSPITAL_COMMUNITY): Payer: Self-pay | Admitting: Gastroenterology

## 2024-03-06 ENCOUNTER — Encounter (HOSPITAL_COMMUNITY): Payer: Self-pay | Admitting: Internal Medicine
